# Patient Record
Sex: Male | Born: 1960 | Race: White | Hispanic: No | Marital: Married | Smoking: Former smoker
Health system: Southern US, Community
[De-identification: ages and names within clinical notes are randomized; demographics above are authoritative.]

## PROBLEM LIST (undated history)

## (undated) DIAGNOSIS — D049 Carcinoma in situ of skin, unspecified: Secondary | ICD-10-CM

## (undated) DIAGNOSIS — M5136 Other intervertebral disc degeneration, lumbar region: Secondary | ICD-10-CM

## (undated) DIAGNOSIS — E669 Obesity, unspecified: Secondary | ICD-10-CM

## (undated) DIAGNOSIS — M51369 Other intervertebral disc degeneration, lumbar region without mention of lumbar back pain or lower extremity pain: Secondary | ICD-10-CM

## (undated) DIAGNOSIS — M4310 Spondylolisthesis, site unspecified: Secondary | ICD-10-CM

## (undated) DIAGNOSIS — N529 Male erectile dysfunction, unspecified: Secondary | ICD-10-CM

## (undated) DIAGNOSIS — C801 Malignant (primary) neoplasm, unspecified: Secondary | ICD-10-CM

## (undated) HISTORY — DX: Male erectile dysfunction, unspecified: N52.9

---

## 1994-07-26 HISTORY — PX: VASECTOMY: SHX75

## 2009-01-30 ENCOUNTER — Ambulatory Visit: Payer: Self-pay | Admitting: Family Medicine

## 2009-11-08 IMAGING — CR CERVICAL SPINE - 2-3 VIEW
1 series · 4 of 4 positions shown · non-contrast
Comparison: none

REASON FOR EXAM: Neck Pain
COMMENTS:

[Series 2: view not recorded · 0.17mm/px · 4 of 4 slices shown]
[im 1/4]
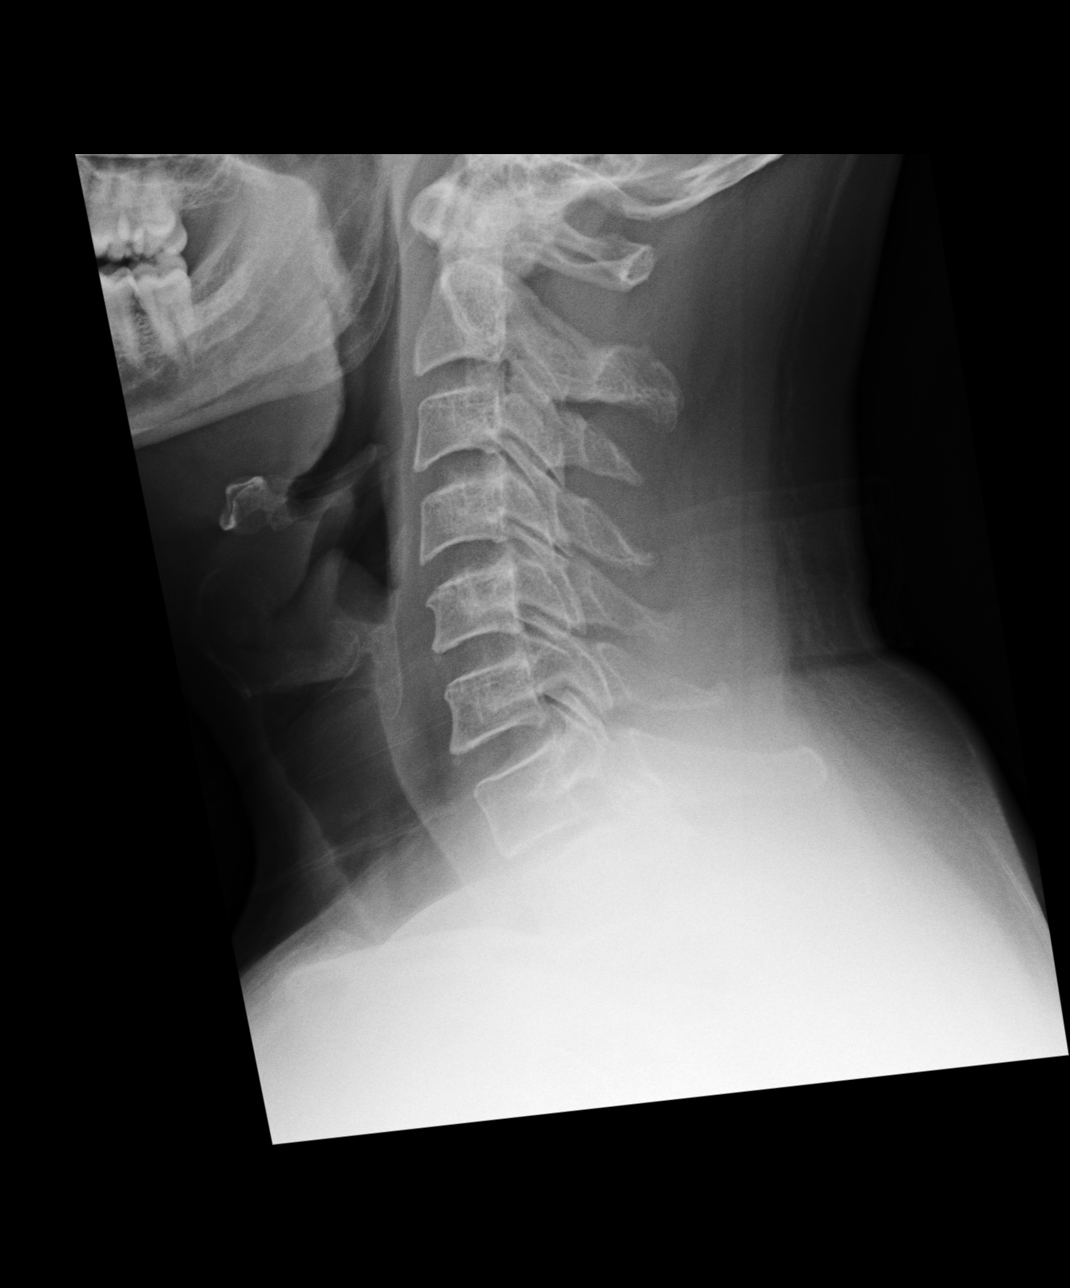
[im 2/4]
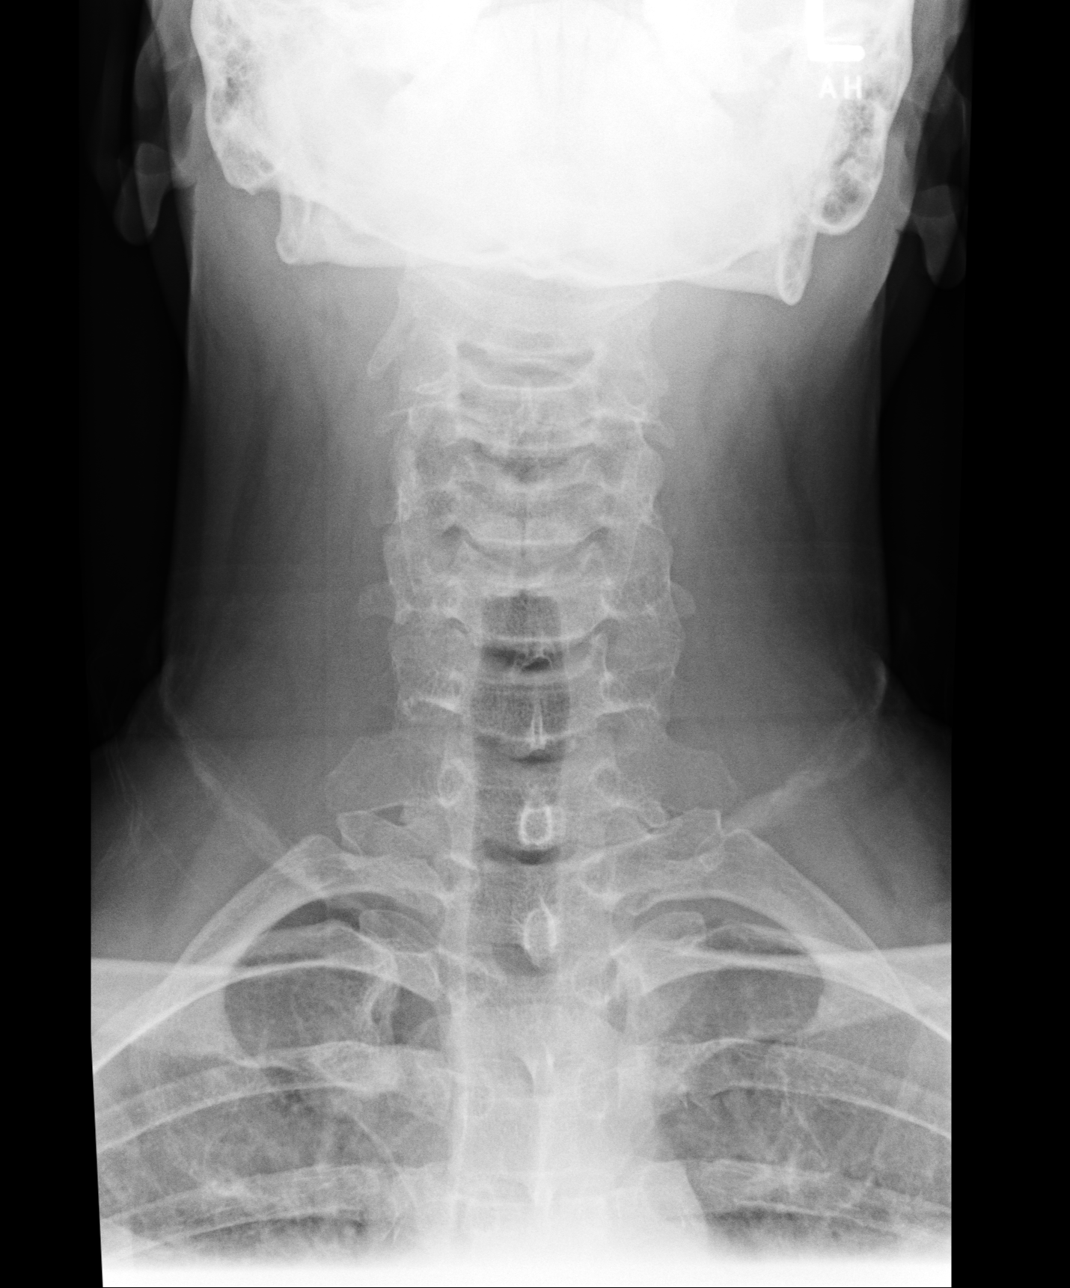
[im 3/4]
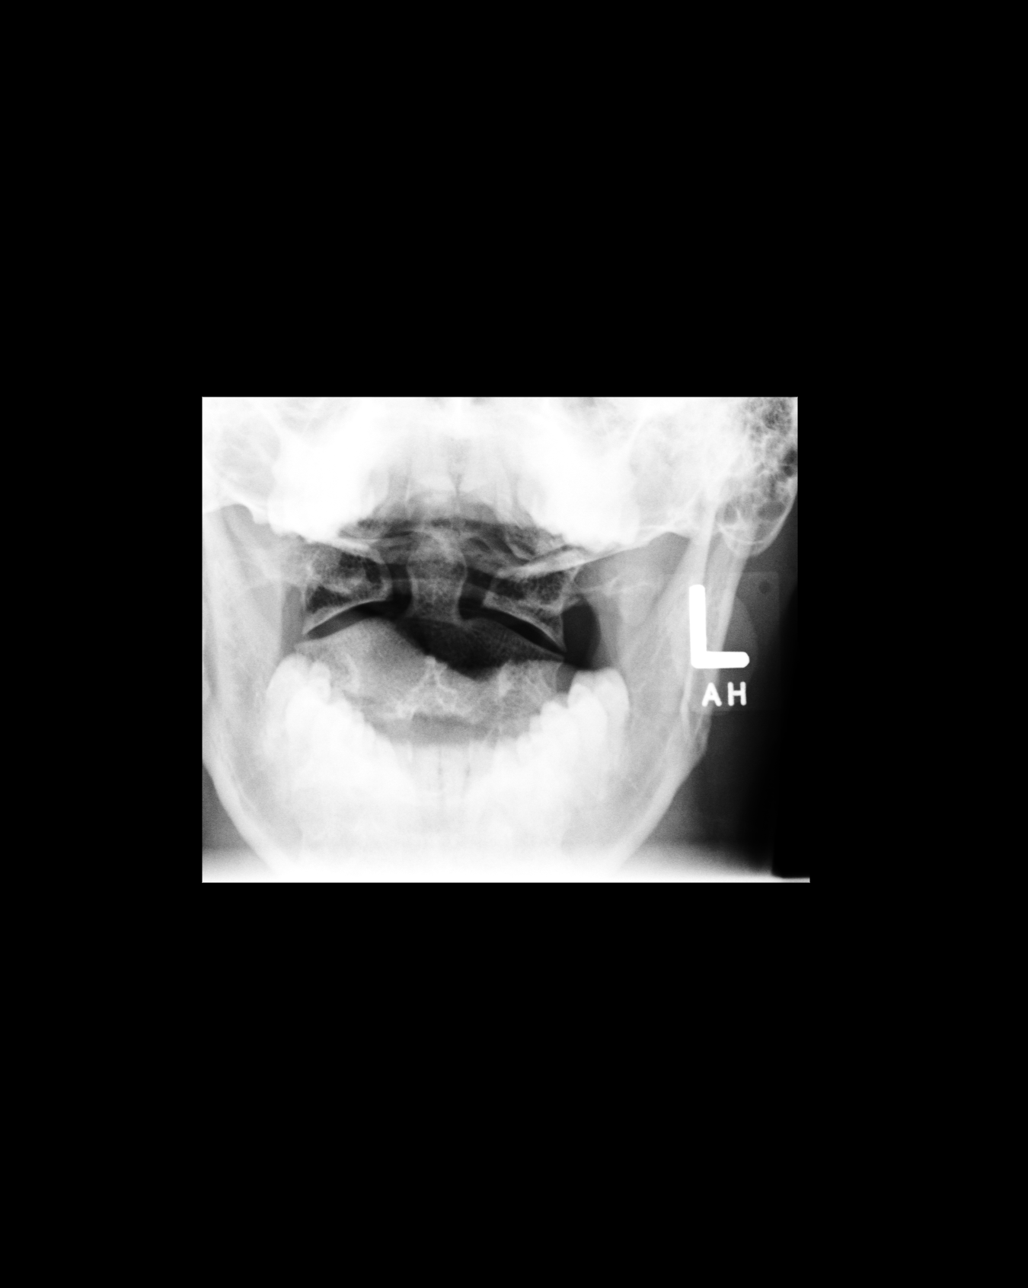
[im 4/4]
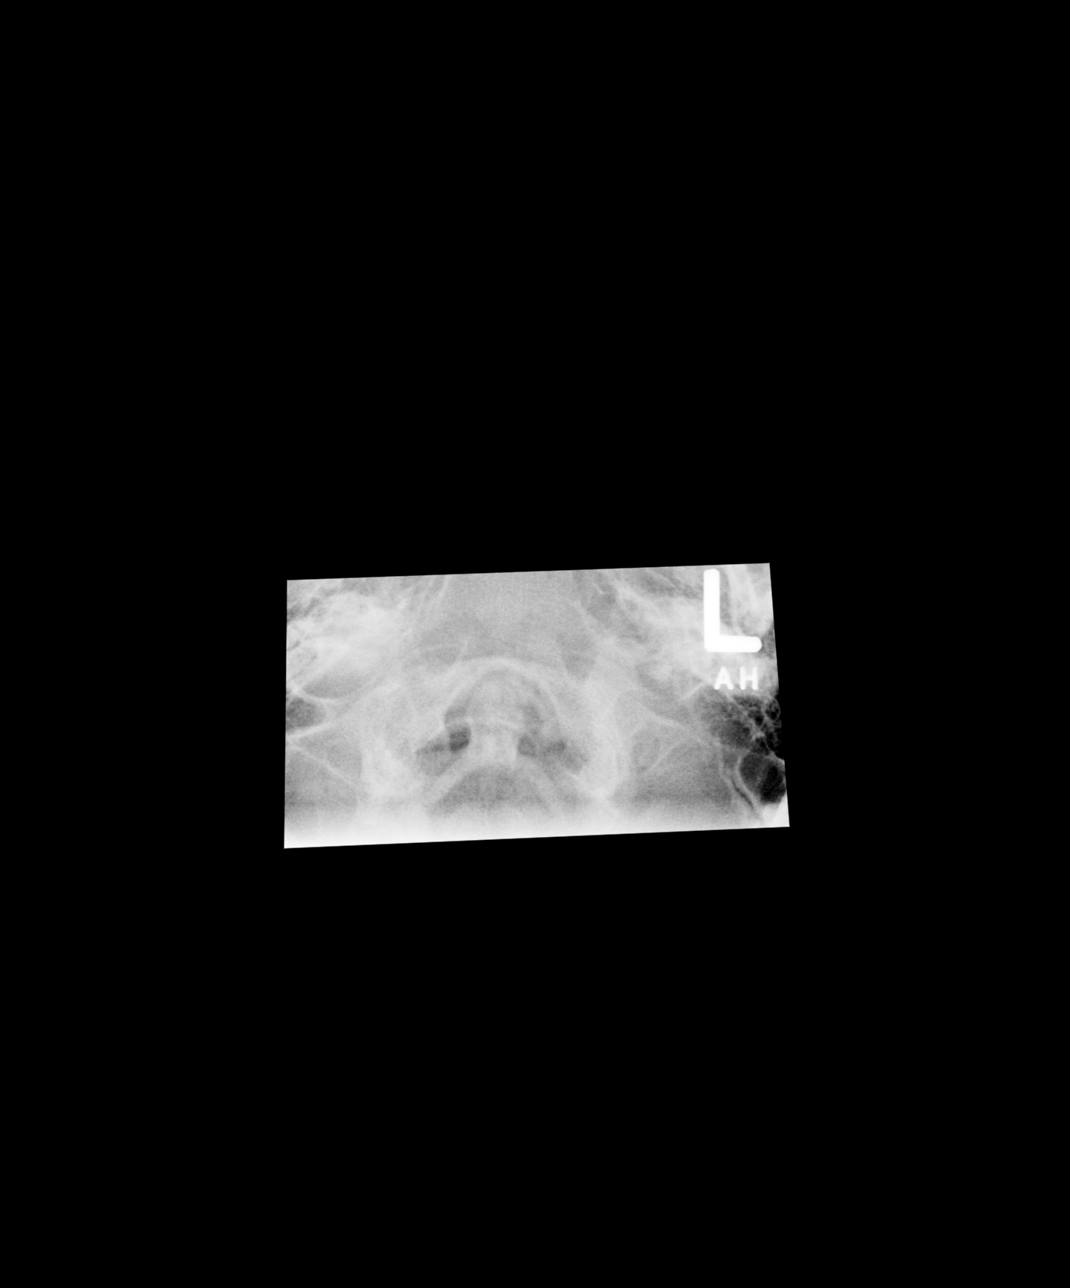

[4 of 4 positions shown; findings below may reference images not displayed]

PROCEDURE:     KDR - KDXR C-SPINE AP AND LATERAL  - January 30, 2009 [DATE]

RESULT:     The vertebral body heights and the intervertebral disc spaces
are well-maintained. The vertebral body alignment is normal. The odontoid
process is intact. No cervical rib formation is seen. There is incidentally
noted slight anterior spur formation at C5. The soft tissues of the cervical
area are normal in appearance.
IMPRESSION: 1.     No acute changes are identified.

## 2013-07-26 HISTORY — PX: MELANOMA EXCISION: SHX5266

## 2015-05-27 ENCOUNTER — Ambulatory Visit (INDEPENDENT_AMBULATORY_CARE_PROVIDER_SITE_OTHER): Payer: BLUE CROSS/BLUE SHIELD | Admitting: Family Medicine

## 2015-05-27 ENCOUNTER — Encounter: Payer: Self-pay | Admitting: Family Medicine

## 2015-05-27 VITALS — BP 122/84 | HR 73 | Temp 98.6°F | Resp 16 | Ht 67.0 in | Wt 229.6 lb

## 2015-05-27 DIAGNOSIS — S83419A Sprain of medial collateral ligament of unspecified knee, initial encounter: Secondary | ICD-10-CM

## 2015-05-27 DIAGNOSIS — N529 Male erectile dysfunction, unspecified: Secondary | ICD-10-CM | POA: Insufficient documentation

## 2015-05-27 DIAGNOSIS — Z9229 Personal history of other drug therapy: Secondary | ICD-10-CM | POA: Insufficient documentation

## 2015-05-27 MED ORDER — MELOXICAM 15 MG PO TABS: 15.0000 mg | ORAL_TABLET | Freq: Every day | ORAL | Status: DC

## 2015-05-27 NOTE — Progress Notes (Signed)
Subjective:     Patient ID: William HoesKarl Gutierrez, male   DOB: [birth year], 54 y.o.   MRN: [id number]  HPI  Chief Complaint  Patient presents with  . Knee Pain    Patient comes in office today with concerns of bilateral knee pain for the past 3 weeks. Patient states that pain came on sudden not related to any injury or accident. Patient states that he is a active runner and plays raquet ball but it had not caused him any pain and he does not recall over exerting himself. Patient reports he has pain when walking up and down stairs and when laying down at night patient describes pain in knees as a ache. Patient reports taking otc Ibuprofen and Excedrin for relief.   States he has taken ibuprofen every other day with mild improvement after the first 1.5 weeks. Prior to developing pain he was walking up to 3 miles a day and playing racquetball for 1.5 hours 3 x week. Knee pain occurred suddenly and simultaneously in both knees.   Review of Systems  Constitutional: Negative for fever and chills.  Musculoskeletal:       No prior hx of knee problems       Objective:   Physical Exam  Constitutional: He appears well-developed and well-nourished. No distress.  Musculoskeletal:  Bilateral knees without effusion or erythema. Tender over medial aspect of both knee with increased pain on MCL testing. No patellar tenderness. Increased pain with flexion > 90 degrees.       Assessment:    1. Knee MCL sprain, unspecified laterality, initial encounter: bilateral - meloxicam (MOBIC) 15 MG tablet; Take 1 tablet (15 mg total) by mouth daily.  Dispense: 30 tablet; Refill: 0    Plan:    Further evaluation if not improving over the next 1-2 weeks. Discussed icing and avoiding racquetball for now.

## 2015-05-27 NOTE — Patient Instructions (Signed)
Ice down your knees for 20 minutes at the end of your day or after walking. If not continuing to improve over the next week or two-call for further evaluation.

## 2019-04-11 ENCOUNTER — Ambulatory Visit: Payer: Self-pay

## 2019-04-11 ENCOUNTER — Other Ambulatory Visit: Payer: Self-pay

## 2019-04-11 DIAGNOSIS — Z23 Encounter for immunization: Secondary | ICD-10-CM

## 2019-11-08 ENCOUNTER — Telehealth: Payer: Self-pay | Admitting: Family Medicine

## 2019-11-08 NOTE — Telephone Encounter (Signed)
Copied from CRM [id number]. Topic: Appointment Scheduling - Scheduling Inquiry for Clinic >> Nov 07, 2019  4:59 PM Randol Kern wrote: Reason for CRM: Pt called to request new patient appt. Formerly under [employer].Is hoping to reestablish with male provider. Advised that none are accepting new patients  Best contact: [phone]   Dr. Sherrie Mustache, would you take this man as a patient.

## 2020-09-18 ENCOUNTER — Ambulatory Visit: Payer: BLUE CROSS/BLUE SHIELD | Admitting: Family Medicine

## 2020-11-11 ENCOUNTER — Ambulatory Visit: Payer: Self-pay | Admitting: Family Medicine

## 2020-12-19 ENCOUNTER — Ambulatory Visit: Payer: Self-pay | Admitting: Family Medicine

## 2021-04-15 ENCOUNTER — Other Ambulatory Visit: Payer: Self-pay

## 2021-04-15 ENCOUNTER — Ambulatory Visit: Payer: Self-pay

## 2021-04-15 DIAGNOSIS — Z23 Encounter for immunization: Secondary | ICD-10-CM

## 2021-10-29 ENCOUNTER — Other Ambulatory Visit: Payer: Self-pay | Admitting: Neurosurgery

## 2021-10-29 DIAGNOSIS — Z01818 Encounter for other preprocedural examination: Secondary | ICD-10-CM

## 2021-12-14 ENCOUNTER — Other Ambulatory Visit: Payer: Self-pay

## 2021-12-14 ENCOUNTER — Encounter: Payer: Self-pay | Admitting: Neurosurgery

## 2021-12-14 ENCOUNTER — Encounter
Admission: RE | Admit: 2021-12-14 | Discharge: 2021-12-14 | Disposition: A | Discharge status: Home / Self Care | Payer: BC Managed Care – PPO | Source: Ambulatory Visit | Attending: Neurosurgery | Admitting: Neurosurgery

## 2021-12-14 VITALS — BP 144/97 | HR 77 | Temp 97.9°F | Resp 18 | Ht 66.0 in | Wt 223.8 lb

## 2021-12-14 DIAGNOSIS — Z01812 Encounter for preprocedural laboratory examination: Secondary | ICD-10-CM | POA: Insufficient documentation

## 2021-12-14 DIAGNOSIS — Z01818 Encounter for other preprocedural examination: Secondary | ICD-10-CM

## 2021-12-14 HISTORY — DX: Carcinoma in situ of skin, unspecified: D04.9

## 2021-12-14 LAB — CBC
HCT: 43.2 % (ref 39.0–52.0)
Hemoglobin: 14.4 g/dL (ref 13.0–17.0)
MCH: 32.5 pg (ref 26.0–34.0)
MCHC: 33.3 g/dL (ref 30.0–36.0)
MCV: 97.5 fL (ref 80.0–100.0)
Platelets: 247 10*3/uL (ref 150–400)
RBC: 4.43 MIL/uL (ref 4.22–5.81)
RDW: 12.8 % (ref 11.5–15.5)
WBC: 5.7 10*3/uL (ref 4.0–10.5)
nRBC: 0 % (ref 0.0–0.2)

## 2021-12-14 LAB — TYPE AND SCREEN
ABO/RH(D): O POS
Antibody Screen: NEGATIVE

## 2021-12-14 LAB — URINALYSIS, ROUTINE W REFLEX MICROSCOPIC
Bilirubin Urine: NEGATIVE
Glucose, UA: NEGATIVE mg/dL
Hgb urine dipstick: NEGATIVE
Ketones, ur: NEGATIVE mg/dL
Leukocytes,Ua: NEGATIVE
Nitrite: NEGATIVE
Protein, ur: NEGATIVE mg/dL
Specific Gravity, Urine: 1.004 — ABNORMAL LOW (ref 1.005–1.030)
pH: 7 (ref 5.0–8.0)

## 2021-12-14 LAB — SURGICAL PCR SCREEN
MRSA, PCR: NEGATIVE
Staphylococcus aureus: POSITIVE — AB

## 2021-12-14 LAB — BASIC METABOLIC PANEL
Anion gap: 7 (ref 5–15)
BUN: 10 mg/dL (ref 6–20)
CO2: 26 mmol/L (ref 22–32)
Calcium: 9.8 mg/dL (ref 8.9–10.3)
Chloride: 108 mmol/L (ref 98–111)
Creatinine, Ser: 0.86 mg/dL (ref 0.61–1.24)
GFR, Estimated: >60 mL/min (ref >60)
Glucose, Bld: 105 mg/dL — ABNORMAL HIGH (ref 70–99)
Potassium: 4.4 mmol/L (ref 3.5–5.1)
Sodium: 141 mmol/L (ref 135–145)

## 2021-12-14 NOTE — Patient Instructions (Addendum)
Your procedure is scheduled on: Wednesday 12/23/21 Report to the Registration Desk on the 1st floor of the [facility]. To find out your arrival time, please call [phone] between 1PM - 3PM on: Tuesday 12/22/21 If your arrival time is 6:00 am, do not arrive prior to that time as the [facility] entrance doors do not open until 6:00 am.  REMEMBER: Instructions that are not followed completely may result in serious medical risk, up to and including death; or upon the discretion of your surgeon and anesthesiologist your surgery may need to be rescheduled.  Do not eat food after midnight the night before surgery.  No gum chewing, lozengers or hard candies.  You may however, drink CLEAR liquids up to 2 hours before you are scheduled to arrive for your surgery. Do not drink anything within 2 hours of your scheduled arrival time.  Clear liquids include: - water  - apple juice without pulp - gatorade (not RED colors) - black coffee or tea (Do NOT add milk or creamers to the coffee or tea) Do NOT drink anything that is not on this list.  TAKE THESE MEDICATIONS THE MORNING OF SURGERY WITH A SIP OF WATER: NONE  One week prior to surgery: Stop Anti-inflammatories (NSAIDS) such as, Advil, Aleve, Ibuprofen, Motrin, Naproxen, Naprosyn and Aspirin based products such as Excedrin, Goodys Powder, BC Powder.  Stop taking yourAscorbic Acid (VITAMIN C PO, CALCIUM PO,Ferrous Sulfate (IRON PO), Multiple Vitamins-Minerals (MULTIVITAMIN ADULTS 50+) TABS, Omega-3 Fatty Acids (OMEGA-3 FISH OIL PO), PSYLLIUM PO, VITAMIN D PO, and  ANY OVER THE COUNTER supplements until after surgery.  You may however, continue to take Tylenol if needed for pain up until the day of surgery.  No Alcohol for 24 hours before or after surgery.  No Smoking including e-cigarettes for 24 hours prior to surgery.  No chewable tobacco products for at least 6 hours prior to surgery.  No nicotine patches on the day of surgery.  Do  not use any "recreational" drugs for at least a week prior to your surgery.  Please be advised that the combination of cocaine and anesthesia may have negative outcomes, up to and including death. If you test positive for cocaine, your surgery will be cancelled.  On the morning of surgery brush your teeth with toothpaste and water, you may rinse your mouth with mouthwash if you wish. Do not swallow any toothpaste or mouthwash.  Use CHG Soap as directed on instruction sheet.  Do not wear jewelry.  Do not wear lotions, powders, or colognes.   Do not shave body from the neck down 48 hours prior to surgery just in case you cut yourself which could leave a site for infection.  Also, freshly shaved skin may become irritated if using the CHG soap.  Do not bring valuables to the hospital. [facility] is not responsible for any missing/lost belongings or valuables.   Notify your doctor if there is any change in your medical condition (cold, fever, infection).  Wear comfortable clothing (specific to your surgery type) to the hospital.  After surgery, you can help prevent lung complications by doing breathing exercises.  Take deep breaths and cough every 1-2 hours.   If you are being admitted to the hospital overnight, leave your suitcase in the car. After surgery it may be brought to your room.  If you are taking public transportation, you will need to have a responsible adult (18 years or older) with you. Please confirm with your physician that  it is acceptable to use public transportation.   Please call the Pre-admissions Testing Dept. at [phone] if you have any questions about these instructions.  Surgery Visitation Policy:  Patients undergoing a surgery or procedure may have two family members or support persons with them as long as the person is not COVID-19 positive or experiencing its symptoms.   Inpatient Visitation:    Visiting hours are 7 a.m. to 8 p.m. Up to four  visitors are allowed at one time in a patient room, including children. The visitors may rotate out with other people during the day. One designated support person (adult) may remain overnight.

## 2021-12-23 ENCOUNTER — Inpatient Hospital Stay
Admission: RE | Admit: 2021-12-23 | Discharge: 2021-12-25 | DRG: 455 | Disposition: A | Discharge status: Home / Self Care | Payer: BC Managed Care – PPO | Attending: Neurosurgery | Admitting: Neurosurgery

## 2021-12-23 ENCOUNTER — Other Ambulatory Visit: Payer: Self-pay

## 2021-12-23 ENCOUNTER — Inpatient Hospital Stay: Payer: BC Managed Care – PPO

## 2021-12-23 ENCOUNTER — Inpatient Hospital Stay: Payer: BC Managed Care – PPO | Admitting: Urgent Care

## 2021-12-23 ENCOUNTER — Encounter: Payer: Self-pay | Admitting: Neurosurgery

## 2021-12-23 ENCOUNTER — Encounter
Admission: RE | Disposition: A | Discharge status: Home / Self Care | Payer: Self-pay | Source: Home / Self Care | Attending: Neurosurgery

## 2021-12-23 DIAGNOSIS — Z85828 Personal history of other malignant neoplasm of skin: Secondary | ICD-10-CM | POA: Diagnosis not present

## 2021-12-23 DIAGNOSIS — F17210 Nicotine dependence, cigarettes, uncomplicated: Secondary | ICD-10-CM | POA: Diagnosis present

## 2021-12-23 DIAGNOSIS — Z833 Family history of diabetes mellitus: Secondary | ICD-10-CM | POA: Diagnosis not present

## 2021-12-23 DIAGNOSIS — Z825 Family history of asthma and other chronic lower respiratory diseases: Secondary | ICD-10-CM

## 2021-12-23 DIAGNOSIS — M5136 Other intervertebral disc degeneration, lumbar region: Secondary | ICD-10-CM | POA: Diagnosis present

## 2021-12-23 DIAGNOSIS — E669 Obesity, unspecified: Secondary | ICD-10-CM | POA: Diagnosis present

## 2021-12-23 DIAGNOSIS — M545 Low back pain, unspecified: Secondary | ICD-10-CM | POA: Diagnosis present

## 2021-12-23 DIAGNOSIS — M4310 Spondylolisthesis, site unspecified: Secondary | ICD-10-CM | POA: Diagnosis not present

## 2021-12-23 DIAGNOSIS — Z8249 Family history of ischemic heart disease and other diseases of the circulatory system: Secondary | ICD-10-CM | POA: Diagnosis not present

## 2021-12-23 DIAGNOSIS — M4306 Spondylolysis, lumbar region: Secondary | ICD-10-CM

## 2021-12-23 DIAGNOSIS — Z981 Arthrodesis status: Secondary | ICD-10-CM

## 2021-12-23 DIAGNOSIS — Z808 Family history of malignant neoplasm of other organs or systems: Secondary | ICD-10-CM

## 2021-12-23 DIAGNOSIS — M4317 Spondylolisthesis, lumbosacral region: Principal | ICD-10-CM | POA: Diagnosis present

## 2021-12-23 DIAGNOSIS — Z6836 Body mass index (BMI) 36.0-36.9, adult: Secondary | ICD-10-CM | POA: Diagnosis not present

## 2021-12-23 HISTORY — DX: Obesity, unspecified: E66.9

## 2021-12-23 HISTORY — PX: APPLICATION OF INTRAOPERATIVE CT SCAN: SHX6668

## 2021-12-23 HISTORY — DX: Other intervertebral disc degeneration, lumbar region: M51.36

## 2021-12-23 HISTORY — DX: Other intervertebral disc degeneration, lumbar region without mention of lumbar back pain or lower extremity pain: M51.369

## 2021-12-23 HISTORY — PX: ABDOMINAL EXPOSURE: SHX5708

## 2021-12-23 HISTORY — DX: Arthrodesis status: Z98.1

## 2021-12-23 HISTORY — PX: ANTERIOR LUMBAR FUSION: SHX1170

## 2021-12-23 HISTORY — DX: Spondylolisthesis, site unspecified: M43.10

## 2021-12-23 LAB — CREATININE, SERUM
Creatinine, Ser: 0.86 mg/dL (ref 0.61–1.24)
GFR, Estimated: >60 mL/min (ref >60)

## 2021-12-23 LAB — CBC
HCT: 42.5 % (ref 39.0–52.0)
Hemoglobin: 14.4 g/dL (ref 13.0–17.0)
MCH: 33 pg (ref 26.0–34.0)
MCHC: 33.9 g/dL (ref 30.0–36.0)
MCV: 97.5 fL (ref 80.0–100.0)
Platelets: 246 10*3/uL (ref 150–400)
RBC: 4.36 MIL/uL (ref 4.22–5.81)
RDW: 12.6 % (ref 11.5–15.5)
WBC: 18 10*3/uL — ABNORMAL HIGH (ref 4.0–10.5)
nRBC: 0 % (ref 0.0–0.2)

## 2021-12-23 LAB — ABO/RH: ABO/RH(D): O POS

## 2021-12-23 SURGERY — APPLICATION OF INTRAOPERATIVE CT SCAN
Anesthesia: General | Site: Spine Lumbar

## 2021-12-23 MED ORDER — CHLORHEXIDINE GLUCONATE 0.12 % MT SOLN
OROMUCOSAL | Status: AC
  Administered 2021-12-23: 15 mL via OROMUCOSAL
  Filled 2021-12-23: qty 15

## 2021-12-23 MED ORDER — ONDANSETRON HCL 4 MG/2ML IJ SOLN
INTRAMUSCULAR | Status: DC | PRN
  Administered 2021-12-23: 4 mg via INTRAVENOUS

## 2021-12-23 MED ORDER — ACETAMINOPHEN 10 MG/ML IV SOLN: 1000.0000 mg | Freq: Once | INTRAVENOUS | Status: DC | PRN

## 2021-12-23 MED ORDER — LIDOCAINE HCL (CARDIAC) PF 100 MG/5ML IV SOSY
PREFILLED_SYRINGE | INTRAVENOUS | Status: DC | PRN
  Administered 2021-12-23: 100 mg via INTRAVENOUS

## 2021-12-23 MED ORDER — PROPOFOL 10 MG/ML IV BOLUS
INTRAVENOUS | Status: DC | PRN
  Administered 2021-12-23: 200 mg via INTRAVENOUS
  Administered 2021-12-23: 100 mg via INTRAVENOUS

## 2021-12-23 MED ORDER — KETAMINE HCL 50 MG/5ML IJ SOSY
PREFILLED_SYRINGE | INTRAMUSCULAR | Status: AC
  Filled 2021-12-23: qty 5

## 2021-12-23 MED ORDER — REMIFENTANIL HCL 1 MG IV SOLR
INTRAVENOUS | Status: AC
  Filled 2021-12-23: qty 1000

## 2021-12-23 MED ORDER — BUPIVACAINE HCL (PF) 0.5 % IJ SOLN
INTRAMUSCULAR | Status: DC | PRN
  Administered 2021-12-23: 10 mL

## 2021-12-23 MED ORDER — FENTANYL CITRATE (PF) 100 MCG/2ML IJ SOLN
INTRAMUSCULAR | Status: AC
  Administered 2021-12-23: 50 ug via INTRAVENOUS
  Filled 2021-12-23: qty 2

## 2021-12-23 MED ORDER — HYDROMORPHONE HCL 1 MG/ML IJ SOLN
INTRAMUSCULAR | Status: AC
  Filled 2021-12-23: qty 1

## 2021-12-23 MED ORDER — METHOCARBAMOL 500 MG PO TABS
500.0000 mg | ORAL_TABLET | Freq: Four times a day (QID) | ORAL | Status: DC | PRN
  Administered 2021-12-23 – 2021-12-24 (×2): 500 mg via ORAL
  Filled 2021-12-23 (×2): qty 1

## 2021-12-23 MED ORDER — ONDANSETRON HCL 4 MG/2ML IJ SOLN: 4.0000 mg | Freq: Once | INTRAMUSCULAR | Status: DC | PRN

## 2021-12-23 MED ORDER — BUPIVACAINE-EPINEPHRINE (PF) 0.5% -1:200000 IJ SOLN
INTRAMUSCULAR | Status: DC | PRN
  Administered 2021-12-23: 9 mL

## 2021-12-23 MED ORDER — KETOROLAC TROMETHAMINE 15 MG/ML IJ SOLN
15.0000 mg | Freq: Four times a day (QID) | INTRAMUSCULAR | Status: AC
  Administered 2021-12-23 – 2021-12-24 (×4): 15 mg via INTRAVENOUS
  Filled 2021-12-23 (×3): qty 1

## 2021-12-23 MED ORDER — SUGAMMADEX SODIUM 200 MG/2ML IV SOLN
INTRAVENOUS | Status: DC | PRN
  Administered 2021-12-23: 50 mg via INTRAVENOUS

## 2021-12-23 MED ORDER — SODIUM CHLORIDE 0.9% FLUSH
3.0000 mL | Freq: Two times a day (BID) | INTRAVENOUS | Status: DC
  Administered 2021-12-23 – 2021-12-24 (×2): 3 mL via INTRAVENOUS

## 2021-12-23 MED ORDER — MENTHOL 3 MG MT LOZG: 1.0000 | LOZENGE | OROMUCOSAL | Status: DC | PRN

## 2021-12-23 MED ORDER — SODIUM CHLORIDE (PF) 0.9 % IJ SOLN
INTRAMUSCULAR | Status: AC
  Filled 2021-12-23: qty 20

## 2021-12-23 MED ORDER — KETAMINE HCL 10 MG/ML IJ SOLN
INTRAMUSCULAR | Status: DC | PRN
  Administered 2021-12-23: 20 mg via INTRAVENOUS
  Administered 2021-12-23 (×2): 10 mg via INTRAVENOUS
  Administered 2021-12-23: 40 mg via INTRAVENOUS

## 2021-12-23 MED ORDER — PROPOFOL 1000 MG/100ML IV EMUL
INTRAVENOUS | Status: AC
Start: 2021-12-23 — End: ?
  Filled 2021-12-23: qty 100

## 2021-12-23 MED ORDER — SENNA 8.6 MG PO TABS
1.0000 | ORAL_TABLET | Freq: Two times a day (BID) | ORAL | Status: DC
Start: 2021-12-23 — End: 2021-12-25
  Administered 2021-12-23 – 2021-12-25 (×4): 8.6 mg via ORAL
  Filled 2021-12-23 (×4): qty 1

## 2021-12-23 MED ORDER — BUPIVACAINE HCL (PF) 0.5 % IJ SOLN
INTRAMUSCULAR | Status: AC
  Filled 2021-12-23: qty 30

## 2021-12-23 MED ORDER — PHENYLEPHRINE 80 MCG/ML (10ML) SYRINGE FOR IV PUSH (FOR BLOOD PRESSURE SUPPORT)
PREFILLED_SYRINGE | INTRAVENOUS | Status: AC
  Filled 2021-12-23: qty 10

## 2021-12-23 MED ORDER — CHLORHEXIDINE GLUCONATE 0.12 % MT SOLN: 15.0000 mL | Freq: Once | OROMUCOSAL | Status: AC

## 2021-12-23 MED ORDER — OXYCODONE HCL 5 MG PO TABS
10.0000 mg | ORAL_TABLET | ORAL | Status: DC | PRN
  Administered 2021-12-24 – 2021-12-25 (×3): 10 mg via ORAL
  Filled 2021-12-23 (×3): qty 2

## 2021-12-23 MED ORDER — FLEET ENEMA 7-19 GM/118ML RE ENEM
1.0000 | ENEMA | Freq: Once | RECTAL | Status: AC | PRN
  Administered 2021-12-24: 1 via RECTAL

## 2021-12-23 MED ORDER — SURGIFLO WITH THROMBIN (HEMOSTATIC MATRIX KIT) OPTIME
TOPICAL | Status: DC | PRN
  Administered 2021-12-23: 1 via TOPICAL

## 2021-12-23 MED ORDER — MORPHINE SULFATE (PF) 2 MG/ML IV SOLN
2.0000 mg | INTRAVENOUS | Status: DC | PRN
  Administered 2021-12-23 – 2021-12-25 (×3): 2 mg via INTRAVENOUS
  Filled 2021-12-23 (×3): qty 1

## 2021-12-23 MED ORDER — FENTANYL CITRATE (PF) 100 MCG/2ML IJ SOLN
25.0000 ug | INTRAMUSCULAR | Status: DC | PRN
  Administered 2021-12-23 (×2): 50 ug via INTRAVENOUS

## 2021-12-23 MED ORDER — OXYCODONE HCL 5 MG PO TABS: 5.0000 mg | ORAL_TABLET | Freq: Once | ORAL | Status: DC | PRN

## 2021-12-23 MED ORDER — MIDAZOLAM HCL 2 MG/2ML IJ SOLN
INTRAMUSCULAR | Status: AC
  Filled 2021-12-23: qty 2

## 2021-12-23 MED ORDER — POLYETHYLENE GLYCOL 3350 17 G PO PACK
17.0000 g | PACK | Freq: Every day | ORAL | Status: DC | PRN
  Administered 2021-12-24: 17 g via ORAL
  Filled 2021-12-23: qty 1

## 2021-12-23 MED ORDER — LACTATED RINGERS IV SOLN: INTRAVENOUS | Status: DC

## 2021-12-23 MED ORDER — LACTATED RINGERS IV SOLN: INTRAVENOUS | Status: DC | PRN

## 2021-12-23 MED ORDER — SURGIPHOR WOUND IRRIGATION SYSTEM - OPTIME
TOPICAL | Status: DC | PRN
  Administered 2021-12-23: 450 mL via TOPICAL

## 2021-12-23 MED ORDER — ROCURONIUM BROMIDE 10 MG/ML (PF) SYRINGE
PREFILLED_SYRINGE | INTRAVENOUS | Status: AC
  Filled 2021-12-23: qty 10

## 2021-12-23 MED ORDER — PHENOL 1.4 % MT LIQD: 1.0000 | OROMUCOSAL | Status: DC | PRN

## 2021-12-23 MED ORDER — SODIUM CHLORIDE FLUSH 0.9 % IV SOLN
INTRAVENOUS | Status: AC
  Filled 2021-12-23: qty 20

## 2021-12-23 MED ORDER — CEFAZOLIN SODIUM-DEXTROSE 2-4 GM/100ML-% IV SOLN
INTRAVENOUS | Status: AC
  Filled 2021-12-23: qty 100

## 2021-12-23 MED ORDER — REMIFENTANIL HCL 1 MG IV SOLR
INTRAVENOUS | Status: DC | PRN
  Administered 2021-12-23: .1 ug/kg/min via INTRAVENOUS
  Administered 2021-12-23: .2 ug/kg/min via INTRAVENOUS

## 2021-12-23 MED ORDER — FENTANYL CITRATE (PF) 100 MCG/2ML IJ SOLN
INTRAMUSCULAR | Status: AC
  Filled 2021-12-23: qty 2

## 2021-12-23 MED ORDER — MIDAZOLAM HCL 2 MG/2ML IJ SOLN
INTRAMUSCULAR | Status: DC | PRN
  Administered 2021-12-23: 2 mg via INTRAVENOUS

## 2021-12-23 MED ORDER — BUPIVACAINE HCL (PF) 0.5 % IJ SOLN
INTRAMUSCULAR | Status: AC
  Filled 2021-12-23: qty 10

## 2021-12-23 MED ORDER — FAMOTIDINE 20 MG PO TABS
ORAL_TABLET | ORAL | Status: AC
  Administered 2021-12-23: 20 mg via ORAL
  Filled 2021-12-23: qty 1

## 2021-12-23 MED ORDER — SODIUM CHLORIDE 0.9 % IV SOLN: INTRAVENOUS | Status: DC

## 2021-12-23 MED ORDER — ACETAMINOPHEN 10 MG/ML IV SOLN
INTRAVENOUS | Status: DC | PRN
  Administered 2021-12-23: 1000 mg via INTRAVENOUS

## 2021-12-23 MED ORDER — ENOXAPARIN SODIUM 40 MG/0.4ML IJ SOSY
40.0000 mg | PREFILLED_SYRINGE | INTRAMUSCULAR | Status: DC
  Administered 2021-12-24 – 2021-12-25 (×2): 40 mg via SUBCUTANEOUS
  Filled 2021-12-23 (×2): qty 0.4

## 2021-12-23 MED ORDER — OXYCODONE HCL 5 MG/5ML PO SOLN: 5.0000 mg | Freq: Once | ORAL | Status: DC | PRN

## 2021-12-23 MED ORDER — ACETAMINOPHEN 500 MG PO TABS
1000.0000 mg | ORAL_TABLET | Freq: Four times a day (QID) | ORAL | Status: AC
  Administered 2021-12-23 – 2021-12-24 (×4): 1000 mg via ORAL
  Filled 2021-12-23 (×4): qty 2

## 2021-12-23 MED ORDER — ACETAMINOPHEN 10 MG/ML IV SOLN
INTRAVENOUS | Status: AC
  Filled 2021-12-23: qty 100

## 2021-12-23 MED ORDER — BUPIVACAINE LIPOSOME 1.3 % IJ SUSP
INTRAMUSCULAR | Status: AC
  Filled 2021-12-23: qty 20

## 2021-12-23 MED ORDER — LIDOCAINE HCL (PF) 2 % IJ SOLN
INTRAMUSCULAR | Status: AC
  Filled 2021-12-23: qty 5

## 2021-12-23 MED ORDER — PROPOFOL 1000 MG/100ML IV EMUL
INTRAVENOUS | Status: AC
  Filled 2021-12-23: qty 100

## 2021-12-23 MED ORDER — BISACODYL 10 MG RE SUPP
10.0000 mg | Freq: Every day | RECTAL | Status: DC | PRN
  Administered 2021-12-24: 10 mg via RECTAL
  Filled 2021-12-23: qty 1

## 2021-12-23 MED ORDER — PROPOFOL 10 MG/ML IV BOLUS
INTRAVENOUS | Status: AC
  Filled 2021-12-23: qty 20

## 2021-12-23 MED ORDER — ORAL CARE MOUTH RINSE: 15.0000 mL | Freq: Once | OROMUCOSAL | Status: AC

## 2021-12-23 MED ORDER — 0.9 % SODIUM CHLORIDE (POUR BTL) OPTIME
TOPICAL | Status: DC | PRN
  Administered 2021-12-23: 1000 mL

## 2021-12-23 MED ORDER — OXYCODONE HCL 5 MG PO TABS
5.0000 mg | ORAL_TABLET | ORAL | Status: DC | PRN
  Administered 2021-12-23 – 2021-12-24 (×3): 5 mg via ORAL
  Filled 2021-12-23 (×3): qty 1

## 2021-12-23 MED ORDER — PHENYLEPHRINE HCL (PRESSORS) 10 MG/ML IV SOLN
INTRAVENOUS | Status: DC | PRN
  Administered 2021-12-23 (×5): 160 ug via INTRAVENOUS

## 2021-12-23 MED ORDER — FAMOTIDINE 20 MG PO TABS: 20.0000 mg | ORAL_TABLET | Freq: Once | ORAL | Status: AC

## 2021-12-23 MED ORDER — PROPOFOL 500 MG/50ML IV EMUL
INTRAVENOUS | Status: DC | PRN
  Administered 2021-12-23: 150 ug/kg/min via INTRAVENOUS
  Administered 2021-12-23 (×3): 100 ug/kg/min via INTRAVENOUS

## 2021-12-23 MED ORDER — BUPIVACAINE LIPOSOME 1.3 % IJ SUSP
INTRAMUSCULAR | Status: DC | PRN
  Administered 2021-12-23: 20 mL

## 2021-12-23 MED ORDER — VANCOMYCIN HCL 1500 MG/300ML IV SOLN
1500.0000 mg | INTRAVENOUS | Status: AC
  Administered 2021-12-23: 1500 mg via INTRAVENOUS
  Filled 2021-12-23: qty 300

## 2021-12-23 MED ORDER — ONDANSETRON HCL 4 MG/2ML IJ SOLN: 4.0000 mg | Freq: Four times a day (QID) | INTRAMUSCULAR | Status: DC | PRN

## 2021-12-23 MED ORDER — HYDROMORPHONE HCL 1 MG/ML IJ SOLN: 0.5000 mg | INTRAMUSCULAR | Status: DC | PRN

## 2021-12-23 MED ORDER — METHOCARBAMOL 1000 MG/10ML IJ SOLN
500.0000 mg | Freq: Four times a day (QID) | INTRAVENOUS | Status: DC | PRN
  Filled 2021-12-23: qty 5

## 2021-12-23 MED ORDER — DEXAMETHASONE SODIUM PHOSPHATE 10 MG/ML IJ SOLN
INTRAMUSCULAR | Status: AC
  Filled 2021-12-23: qty 1

## 2021-12-23 MED ORDER — ONDANSETRON HCL 4 MG/2ML IJ SOLN
INTRAMUSCULAR | Status: AC
  Filled 2021-12-23: qty 2

## 2021-12-23 MED ORDER — CEFAZOLIN SODIUM-DEXTROSE 2-4 GM/100ML-% IV SOLN
2.0000 g | INTRAVENOUS | Status: AC
Start: 2021-12-23 — End: 2021-12-23
  Administered 2021-12-23 (×2): 2 g via INTRAVENOUS

## 2021-12-23 MED ORDER — FENTANYL CITRATE (PF) 100 MCG/2ML IJ SOLN
INTRAMUSCULAR | Status: DC | PRN
  Administered 2021-12-23 (×2): 100 ug via INTRAVENOUS

## 2021-12-23 MED ORDER — SODIUM CHLORIDE 0.9 % IV SOLN: 250.0000 mL | INTRAVENOUS | Status: DC

## 2021-12-23 MED ORDER — DEXAMETHASONE SODIUM PHOSPHATE 10 MG/ML IJ SOLN
INTRAMUSCULAR | Status: DC | PRN
  Administered 2021-12-23 (×2): 10 mg via INTRAVENOUS

## 2021-12-23 MED ORDER — ONDANSETRON HCL 4 MG PO TABS
4.0000 mg | ORAL_TABLET | Freq: Four times a day (QID) | ORAL | Status: DC | PRN
Start: 2021-12-23 — End: 2021-12-25

## 2021-12-23 MED ORDER — SODIUM CHLORIDE 0.9% FLUSH
3.0000 mL | INTRAVENOUS | Status: DC | PRN
Start: 2021-12-23 — End: 2021-12-24

## 2021-12-23 MED ORDER — ROCURONIUM BROMIDE 100 MG/10ML IV SOLN
INTRAVENOUS | Status: DC | PRN
  Administered 2021-12-23: 20 mg via INTRAVENOUS
  Administered 2021-12-23: 70 mg via INTRAVENOUS
  Administered 2021-12-23 (×2): 10 mg via INTRAVENOUS
  Administered 2021-12-23: 20 mg via INTRAVENOUS

## 2021-12-23 MED ORDER — KETOROLAC TROMETHAMINE 15 MG/ML IJ SOLN
INTRAMUSCULAR | Status: AC
  Filled 2021-12-23: qty 1

## 2021-12-23 MED ORDER — BUPIVACAINE-EPINEPHRINE (PF) 0.5% -1:200000 IJ SOLN
INTRAMUSCULAR | Status: AC
Start: 2021-12-23 — End: ?
  Filled 2021-12-23: qty 30

## 2021-12-23 MED ORDER — DOCUSATE SODIUM 100 MG PO CAPS
100.0000 mg | ORAL_CAPSULE | Freq: Two times a day (BID) | ORAL | Status: DC
  Administered 2021-12-23 – 2021-12-25 (×4): 100 mg via ORAL
  Filled 2021-12-23 (×4): qty 1

## 2021-12-23 SURGICAL SUPPLY — 105 items
ADH SKN CLS APL DERMABOND .7 (GAUZE/BANDAGES/DRESSINGS) ×3
AGENT HMST KT MTR STRL THRMB (HEMOSTASIS) ×1
APL PRP STRL LF DISP 70% ISPRP (MISCELLANEOUS) ×5
CAP LOCKING SPINE (Cap) ×4 IMPLANT
CHLORAPREP W/TINT 26 (MISCELLANEOUS) ×9 IMPLANT
CORD BIP STRL DISP 12FT (MISCELLANEOUS) ×2 IMPLANT
COUNTER NEEDLE 20/40 LG (NEEDLE) ×4 IMPLANT
COVER BACK TABLE REUSABLE LG (DRAPES) ×2 IMPLANT
CUP MEDICINE 2OZ PLAST GRAD ST (MISCELLANEOUS) ×2 IMPLANT
DERMABOND ADVANCED (GAUZE/BANDAGES/DRESSINGS) ×3
DERMABOND ADVANCED .7 DNX12 (GAUZE/BANDAGES/DRESSINGS) ×3 IMPLANT
DRAPE 3D C-ARM OEC (DRAPES) ×2 IMPLANT
DRAPE C-ARMOR (DRAPES) IMPLANT
DRAPE INCISE IOBAN 66X60 STRL (DRAPES) ×2 IMPLANT
DRAPE LAPAROTOMY 100X77 ABD (DRAPES) ×2 IMPLANT
DRAPE SCAN PATIENT (DRAPES) ×2 IMPLANT
DRAPE SURG 17X11 SM STRL (DRAPES) ×4 IMPLANT
DRESSING SURGICEL FIBRLLR 1X2 (HEMOSTASIS) IMPLANT
DRSG OPSITE POSTOP 4X6 (GAUZE/BANDAGES/DRESSINGS) ×3 IMPLANT
DRSG OPSITE POSTOP 4X8 (GAUZE/BANDAGES/DRESSINGS) ×1 IMPLANT
DRSG SURGICEL FIBRILLAR 1X2 (HEMOSTASIS)
ELECT BLADE 6.5 EXT (BLADE) ×2 IMPLANT
ELECT REM PT RETURN 9FT ADLT (ELECTROSURGICAL) ×4
ELECTRODE REM PT RTRN 9FT ADLT (ELECTROSURGICAL) ×2 IMPLANT
EX-PIN ORTHOLOCK NAV 4X150 (PIN) IMPLANT
FORCEPS BPLR BAYO 10IN 1.0TIP (ORTHOPEDIC DISPOSABLE SUPPLIES) ×1 IMPLANT
GLOVE BIOGEL PI IND STRL 6.5 (GLOVE) ×2 IMPLANT
GLOVE BIOGEL PI IND STRL 8.5 (GLOVE) ×3 IMPLANT
GLOVE BIOGEL PI INDICATOR 6.5 (GLOVE) ×2
GLOVE BIOGEL PI INDICATOR 8.5 (GLOVE) ×3
GLOVE SURG SYN 6.5 ES PF (GLOVE) ×4 IMPLANT
GLOVE SURG SYN 6.5 PF PI (GLOVE) ×2 IMPLANT
GLOVE SURG SYN 7.0 (GLOVE) ×8 IMPLANT
GLOVE SURG SYN 7.0 PF PI (GLOVE) ×4 IMPLANT
GLOVE SURG SYN 8.0 (GLOVE) ×6 IMPLANT
GLOVE SURG SYN 8.0 PF PI (GLOVE) ×3 IMPLANT
GLOVE SURG SYN 8.5  E (GLOVE) ×6
GLOVE SURG SYN 8.5 E (GLOVE) ×6 IMPLANT
GLOVE SURG SYN 8.5 PF PI (GLOVE) ×6 IMPLANT
GOWN SRG LRG LVL 4 IMPRV REINF (GOWNS) ×2 IMPLANT
GOWN SRG XL LVL 3 NONREINFORCE (GOWNS) ×4 IMPLANT
GOWN STRL NON-REIN TWL XL LVL3 (GOWNS) ×8
GOWN STRL REIN LRG LVL4 (GOWNS) ×4
GOWN STRL REUS W/ TWL XL LVL3 (GOWN DISPOSABLE) ×4 IMPLANT
GOWN STRL REUS W/TWL XL LVL3 (GOWN DISPOSABLE) ×8
GRADUATE 1200CC STRL 31836 (MISCELLANEOUS) ×2 IMPLANT
K-WIRE 1.6 NITINOL SHARP TIP (WIRE) ×8
KIT DISP MARS 3V (KITS) ×1 IMPLANT
KIT INFUSE MEDIUM (Orthopedic Implant) ×1 IMPLANT
KIT SPINAL PRONEVIEW (KITS) ×2 IMPLANT
KIT TURNOVER KIT A (KITS) ×2 IMPLANT
KWIRE 1.6 NITINOL SHARP TIP (WIRE) IMPLANT
LOOP RED MAXI  1X406MM (MISCELLANEOUS) ×1
LOOP VESSEL MAXI 1X406 RED (MISCELLANEOUS) ×1 IMPLANT
LOOP VESSEL MINI 0.8X406 BLUE (MISCELLANEOUS) ×2 IMPLANT
LOOPS BLUE MINI 0.8X406MM (MISCELLANEOUS) ×2
MANIFOLD NEPTUNE II (INSTRUMENTS) ×4 IMPLANT
MARKER SKIN DUAL TIP RULER LAB (MISCELLANEOUS) ×6 IMPLANT
MARKER SPHERE PSV REFLC 13MM (MARKER) ×14 IMPLANT
NDL SAFETY ECLIPSE 18X1.5 (NEEDLE) ×1 IMPLANT
NEEDLE HYPO 18GX1.5 SHARP (NEEDLE) ×2
NEEDLE HYPO 22GX1.5 SAFETY (NEEDLE) ×4 IMPLANT
PACK LAMINECTOMY NEURO (CUSTOM PROCEDURE TRAY) ×2 IMPLANT
PACK UNIVERSAL (MISCELLANEOUS) ×2 IMPLANT
PAD ARMBOARD 7.5X6 YLW CONV (MISCELLANEOUS) ×2 IMPLANT
PENCIL ELECTRO HAND CTR (MISCELLANEOUS) ×3 IMPLANT
PLEDGET CV PTFE 7X3 (MISCELLANEOUS) IMPLANT
ROD SPINE CURVE 5.5X50 (Rod) ×2 IMPLANT
SCREW CREO 6.5X45 FEN (Screw) ×4 IMPLANT
SCREW CREO MIS 30 TULIP (Screw) ×4 IMPLANT
SCREW LOCKING SD 25 (Screw) ×4 IMPLANT
SOLUTION IRRIG SURGIPHOR (IV SOLUTION) ×2 IMPLANT
SPACER SMALL 31X17X25 15D (Spacer) ×1 IMPLANT
SPONGE KITTNER 5P (MISCELLANEOUS) ×4 IMPLANT
SPONGE T-LAP 18X18 ~~LOC~~+RFID (SPONGE) ×2 IMPLANT
STAPLER SKIN PROX 35W (STAPLE) ×4 IMPLANT
SURGIFLO W/THROMBIN 8M KIT (HEMOSTASIS) ×2 IMPLANT
SUT DVC VLOC 3-0 CL 6 P-12 (SUTURE) ×4 IMPLANT
SUT ETHILON 3-0 FS-10 30 BLK (SUTURE)
SUT MNCRL 4-0 (SUTURE) ×2
SUT MNCRL 4-0 27XMFL (SUTURE) ×1
SUT PROLENE 2 0 SH DA (SUTURE) IMPLANT
SUT PROLENE 3 0 SH DA (SUTURE) IMPLANT
SUT PROLENE 5 0 RB 1 DA (SUTURE) ×4 IMPLANT
SUT SILK 2 0 (SUTURE) ×2
SUT SILK 2-0 30XBRD TIE 12 (SUTURE) ×1 IMPLANT
SUT SILK 3 0 REEL (SUTURE) ×2 IMPLANT
SUT VIC AB 0 CT1 27 (SUTURE) ×4
SUT VIC AB 0 CT1 27XCR 8 STRN (SUTURE) ×2 IMPLANT
SUT VIC AB 0 CT1 36 (SUTURE) ×3 IMPLANT
SUT VIC AB 2-0 CT1 (SUTURE) ×2 IMPLANT
SUT VIC AB 2-0 CT1 18 (SUTURE) ×6 IMPLANT
SUT VIC AB 3-0 SH 27 (SUTURE) ×4
SUT VIC AB 3-0 SH 27X BRD (SUTURE) ×2 IMPLANT
SUT VICRYL 3-0 CR8 SH (SUTURE) ×1 IMPLANT
SUT VICRYL+ 3-0 36IN CT-1 (SUTURE) ×1 IMPLANT
SUTURE EHLN 3-0 FS-10 30 BLK (SUTURE) IMPLANT
SUTURE MNCRL 4-0 27XMF (SUTURE) ×1 IMPLANT
SYR 10ML LL (SYRINGE) ×2 IMPLANT
SYR 30ML LL (SYRINGE) ×4 IMPLANT
TOWEL OR 17X26 4PK STRL BLUE (TOWEL DISPOSABLE) ×10 IMPLANT
TRAY FOLEY MTR SLVR 16FR STAT (SET/KITS/TRAYS/PACK) ×2 IMPLANT
TROCAR INSERT W/PEDICLE NDL (TROCAR) IMPLANT
TROCAR INSERT W/PEDICLE NDLE (TROCAR)
TUBING CONNECTING 10 (TUBING) ×5 IMPLANT

## 2021-12-23 NOTE — Anesthesia Procedure Notes (Signed)
Procedure Name: Intubation Date/Time: 12/23/2021 7:31 AM Performed by: Kerri Perches, CRNA Pre-anesthesia Checklist: Patient identified, Patient being monitored, Timeout performed, Emergency Drugs available and Suction available Patient Re-evaluated:Patient Re-evaluated prior to induction Oxygen Delivery Method: Circle system utilized Preoxygenation: Pre-oxygenation with 100% oxygen Induction Type: IV induction Ventilation: Mask ventilation without difficulty Laryngoscope Size: Mac and 4 Grade View: Grade I Tube type: Oral Tube size: 7.5 mm Number of attempts: 1 Airway Equipment and Method: Stylet Placement Confirmation: ETT inserted through vocal cords under direct vision, positive ETCO2 and breath sounds checked- equal and bilateral Secured at: 21 cm Tube secured with: Tape Dental Injury: Teeth and Oropharynx as per pre-operative assessment  Comments: Intubated by Everlene Other SRNA

## 2021-12-23 NOTE — Transfer of Care (Signed)
Immediate Anesthesia Transfer of Care Note  Patient: [place] Gipe  Procedure(s) Performed: APPLICATION OF INTRAOPERATIVE CT SCAN (Spine Lumbar) ANTERIOR LUMBAR FUSION 1 LEVEL L5-S1, POSTERIOR SPINAL FUSION L5-S1 (Spine Lumbar) ABDOMINAL EXPOSURE (Spine Lumbar)  Patient Location: PACU  Anesthesia Type:General  Level of Consciousness: drowsy  Airway & Oxygen Therapy: Patient Spontanous Breathing and Patient connected to face mask  Post-op Assessment: Report given to RN, Post -op Vital signs reviewed and stable and Patient moving all extremities X 4  Post vital signs: Reviewed and stable  Last Vitals:  Vitals Value Taken Time  BP 141/97 12/23/21 1226  Temp    Pulse 77 12/23/21 1230  Resp 14 12/23/21 1230  SpO2 99 % 12/23/21 1230  Vitals shown include unvalidated device data.  Last Pain:  Vitals:   12/23/21 0622  TempSrc: Oral  PainSc: 1          Complications: No notable events documented.

## 2021-12-23 NOTE — Progress Notes (Signed)
Dilaudid taken out in pacu and not given, umable to return to pyxis, Dilaudid taken to pharmacy and given to [facility].

## 2021-12-23 NOTE — Op Note (Signed)
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   Preoperative Note:   Risks of surgery discussed include: infection, bleeding, stroke, coma, death, paralysis, CSF leak, nerve/spinal cord injury, numbness, tingling, weakness, complex regional pain syndrome, recurrent stenosis and/or disc herniation, vascular injury, development of instability, neck/back pain, need for further surgery, persistent symptoms, development of deformity, and the risks of anesthesia. The patient understood these risks and agreed to proceed.  Please note that Drs. Dew and Schnier acted as co-surgeons for access to the anterior lumbar spine.  They will dictate a separate note.   NAME OF ANTERIOR PROCEDURE:               1. Anterior lumbar interbody fusion via a retroperitoneal approach at L5/S1 2. Placement of a Lordotic Globus Monument interbody cage, filled with Demineralized Bone Matrix and BMP  NAME OF POSTERIOR PROCEDURE: 1. Posterior instrumentation using Globus Creo Instrumentation 2. Posterolateral fusion, L5/S1  3. Use of stereotaxis ([facility])   PROCEDURE:  Patient was brought to the operating room, intubated, turned to the lateral position.  All pressure points were checked and double-checked.  The patient was prepped and draped in the standard fashion.   Drs. Dew and Schnier scrubbed in for exposure.  After they had adequately exposed the operative site, I scrubbed in and confirmed localization.  We then proceeded to perform discectomy at L5/S1. I utilized a combination of  rongeurs, rasp, curettes, and Kerrison punches to remove the majority of the disc.  We then introduced the trials and sized up to an appropriate size with good grip.  The endplates were prepared for arthrodesis.  We then introduced the Globus Monument interbody device, filled with BMP.  This was secured into position using screws at L5 and S1. The reduction mechanism was engaged and the anterolisthesis reduced. The final tightener was engaged.  Final radiographs were taken to confirm adequate placement of the graft.  Vascular surgery then scrubbed back in for closure.    After closing the anterior part in layers, the patient was repositioned into prone position.  All pressure points were checked and double-checked.  The posterior operative site was prepped and draped in standard fashion.  The stereotactic array was placed.  Stereotactic images were acquired using intraoperative CT scanning.  This was registered to the patient.  Using stereotaxis, screw trajectories were planned and incisions made.  The pedicles from L5 to S1 were cannulated bilaterally and K wires used to secure the tracks.  We then utilized a stereotactic screwdriver to place pedicle screws from L5 to S1.  At each level, 6.5x7mm Globus Creo pedicle screws were placed.  Once the screws were placed, the screw extensions were then linked, a path was formed for the rod and a rod was utilized to connect the screws.  We then compressed, torqued / counter-torqued and removed the screw assembly. Once performed on each side, the C-arm was brought back and to take confirmatory CT scan showing appropriate placement of all instrumentation and anatomic alignment.    Posterolateral arthrodesis was performed at L5-S1 using the remainder of the BMP.  Again we confirmed radiographically and began our  closure.  The wound was closed using 0 Vicryl interrupted suture in the fascia, 2-0 Vicryl inverted suture were placed in the subcutaneous tissue and  dermis. 3-0 monocryl was used for final closure. Dermabond was used to close the skin.    Needle, lap and all counts were correct at the end of the case.     Manning Charity PA assisted in the entire procedure.  Venetia Night MD Neurosurgery

## 2021-12-23 NOTE — Anesthesia Preprocedure Evaluation (Signed)
Anesthesia Evaluation  Patient identified by MRN, date of birth, ID band Patient awake    Reviewed: Allergy & Precautions, NPO status , Patient's Chart, lab work & pertinent test results  History of Anesthesia Complications Negative for: history of anesthetic complications  Airway Mallampati: I   Neck ROM: Full    Dental  (+) Missing   Pulmonary former smoker (quit 07/2021),    Pulmonary exam normal breath sounds clear to auscultation       Cardiovascular Exercise Tolerance: Good negative cardio ROS Normal cardiovascular exam Rhythm:Regular Rate:Normal     Neuro/Psych negative neurological ROS     GI/Hepatic negative GI ROS,   Endo/Other  Obesity   Renal/GU negative Renal ROS     Musculoskeletal Skin BCC   Abdominal   Peds  Hematology negative hematology ROS (+)   Anesthesia Other Findings   Reproductive/Obstetrics                             Anesthesia Physical Anesthesia Plan  ASA: 2  Anesthesia Plan: General   Post-op Pain Management: Regional block*   Induction: Intravenous  PONV Risk Score and Plan: 2 and Ondansetron, Dexamethasone and Treatment may vary due to age or medical condition  Airway Management Planned: Oral ETT  Additional Equipment:   Intra-op Plan:   Post-operative Plan: Extubation in OR  Informed Consent: I have reviewed the patients History and Physical, chart, labs and discussed the procedure including the risks, benefits and alternatives for the proposed anesthesia with the patient or authorized representative who has indicated his/her understanding and acceptance.     Dental advisory given  Plan Discussed with: CRNA  Anesthesia Plan Comments: (Plan for postoperative TAP blocks.  Patient consented for risks of anesthesia including but not limited to:  - adverse reactions to medications - damage to eyes, teeth, lips or other oral mucosa - nerve  damage due to positioning  - sore throat or hoarseness - damage to heart, brain, nerves, lungs, other parts of body or loss of life  Informed patient about role of CRNA in peri- and intra-operative care.  Patient voiced understanding.)        Anesthesia Quick Evaluation

## 2021-12-23 NOTE — Progress Notes (Signed)
PHARMACY -  BRIEF ANTIBIOTIC NOTE   Pharmacy has received consult(s) for Cefazolin and Vancomycin from an OR provider.  The patient's profile has been reviewed for ht/wt/allergies/indication/available labs.    One time order(s) placed for Cefazolin 2 gm and Vancomycin (15 mg/kg) 1500 mg per pt wt: 101.5 kg  Further antibiotics/pharmacy consults should be ordered by admitting physician if indicated.                       Thank you, Otelia Sergeant, PharmD, MBA 12/23/2021 1:40 AM

## 2021-12-23 NOTE — Plan of Care (Signed)
  Problem: Education: Goal: Ability to verbalize activity precautions or restrictions will improve Outcome: Progressing Goal: Knowledge of the prescribed therapeutic regimen will improve Outcome: Progressing Goal: Understanding of discharge needs will improve Outcome: Progressing   Problem: Activity: Goal: Ability to avoid complications of mobility impairment will improve Outcome: Progressing Goal: Ability to tolerate increased activity will improve Outcome: Progressing Goal: Will remain free from falls Outcome: Progressing   Problem: Bowel/Gastric: Goal: Gastrointestinal status for postoperative course will improve Outcome: Progressing   Problem: Clinical Measurements: Goal: Ability to maintain clinical measurements within normal limits will improve Outcome: Progressing Goal: Postoperative complications will be avoided or minimized Outcome: Progressing Goal: Diagnostic test results will improve Outcome: Progressing   Problem: Pain Management: Goal: Pain level will decrease Outcome: Progressing   Problem: Skin Integrity: Goal: Will show signs of wound healing Outcome: Progressing   Problem: Health Behavior/Discharge Planning: Goal: Identification of resources available to assist in meeting health care needs will improve Outcome: Progressing   Problem: Education: Goal: Knowledge of General Education information will improve Description: Including pain rating scale, medication(s)/side effects and non-pharmacologic comfort measures Outcome: Progressing   Problem: Bladder/Genitourinary: Goal: Urinary functional status for postoperative course will improve Outcome: Progressing   Problem: Clinical Measurements: Goal: Ability to maintain clinical measurements within normal limits will improve Outcome: Progressing Goal: Will remain free from infection Outcome: Progressing Goal: Diagnostic test results will improve Outcome: Progressing Goal: Respiratory complications  will improve Outcome: Progressing Goal: Cardiovascular complication will be avoided Outcome: Progressing   Problem: Nutrition: Goal: Adequate nutrition will be maintained Outcome: Progressing   Problem: Pain Managment: Goal: General experience of comfort will improve Outcome: Progressing   Problem: Skin Integrity: Goal: Risk for impaired skin integrity will decrease Outcome: Progressing

## 2021-12-23 NOTE — H&P (Signed)
History of Present Illness: 12/23/2021 William Gutierrez presents today with continued symptoms.  09/17/2021  William Gutierrez presents back to see me. He has continued and worsening symptoms. He has tried and failed physical therapy and injections. Having trouble walking.  02/19/2021 William Gutierrez has not had significant changes. He is still limited by pain. He comes today to review imaging.  He is down to smoking 2 cigarettes/day  01/08/2021 William Gutierrez is here today with a chief complaint of right-sided low back pain that radiates to his right buttock and lateral thigh. He also reports tingling in his right foot when standing and walking.  He can walk approximately quarter mile before he has pain primarily in his right leg. Laying on his back for more than 20 to 30 minutes cause pain. The pain is primarily in his right buttock and upper leg.  Sitting or bending can make his pain better. He denies weakness. His pain worsens through the day.  Bowel/Bladder Dysfunction: none  Conservative measures:  Physical therapy: participated in 5 visits at [facility] 12/18/20 - 01/06/21 Multimodal medical therapy including regular antiinflammatories: ibuprofen, Excedrin, delta 8 gummies Injections: has not received epidural steroid injections  Past Surgery: denies  William Gutierrez has no symptoms of cervical myelopathy.  The symptoms are causing a significant impact on the patient's life.   Review of Systems:  A 10 point review of systems is negative, except for the pertinent positives and negatives detailed in the HPI.  Past Medical History: Past Medical History:  Diagnosis Date   Allergy   Basal cell carcinoma (BCC) in situ of skin 2014   Past Surgical History: Past Surgical History:  Procedure Laterality Date   VASECTOMY 1997   ORAL SURGERY OPERATIVE NOTE 2016   No Known Allergies  No outpatient medications have been marked as taking for the 12/23/21 encounter [facility] Encounter).     Social History: Social History   Tobacco Use   Smoking status: Former  Packs/day: 0.25  Types: Cigarettes  Quit date: 06/27/2021  Years since quitting: 0.2   Smokeless tobacco: Never  Vaping Use   Vaping Use: Never used  Substance Use Topics   Alcohol use: Yes  Alcohol/week: 2.0 standard drinks  Types: 2 Cans of beer per week   Drug use: Not Currently  Comment: THC occasionally   Family Medical History: Family History  Problem Relation Age of Onset   Hypotension Mother   Skin cancer Mother   High blood pressure (Hypertension) Father   Diabetes type II Father   Heart failure Father   Coronary Artery Disease (Blocked arteries around heart) Father   Depression Sister   Liver cancer Brother   Diabetes type II Brother   No Known Problems Child   No Known Problems Son   Rashes / Skin problems Son   Eczema Son   Cancer Maternal Grandmother   Heart failure Paternal Grandmother   Valvular heart disease Paternal Grandmother   COPD Paternal Grandfather   Physical Examination:  Vitals:   12/23/21 0622  BP: 120/90  Pulse: 84  Resp: 16  Temp: 98 F (36.7 C)  SpO2: 100%    Heart sounds normal no MRG. Chest Clear to Auscultation Bilaterally.  General: Patient is well developed, well nourished, calm, collected, and in no apparent distress. Attention to examination is appropriate.  Psychiatric: Patient is non-anxious.  Head: Pupils equal, round, and reactive to light.  ENT: Oral mucosa appears well hydrated.  Neck: Supple. Full range of motion.  Respiratory: Patient  is breathing without any difficulty.  Extremities: No edema.  Vascular: Palpable dorsal pedal pulses.  Skin: On exposed skin, there are no abnormal skin lesions.  NEUROLOGICAL:   Awake, alert, oriented to person, place, and time. Speech is clear and fluent. Fund of knowledge is appropriate.   Cranial Nerves: Pupils equal round and reactive to light. Facial tone is symmetric. Facial sensation  is symmetric. Shoulder shrug is symmetric. Tongue protrusion is midline. There is no pronator drift.  ROM of spine: full. Strength: Side Biceps Triceps Deltoid Interossei Grip Wrist Ext. Wrist Flex.  R 5 5 5 5 5 5 5   L 5 5 5 5 5 5 5    Side Iliopsoas Quads Hamstring PF DF EHL  R 5 5 5 5 5 5   L 5 5 5 5 5 5    Reflexes are 1+ and symmetric at the biceps, triceps, brachioradialis, patella and achilles. Hoffman's is absent.  Clonus is not present. Toes are down-going.  Bilateral upper and lower extremity sensation is intact to light touch.  Gait is normal.  No difficulty with tandem gait.  No evidence of dysmetria noted.  Medical Decision Making  Imaging: Flexion-extension x-rays from Dec 05, 2020 show L5 pars defects with evidence of abnormal motion on flexion-extension films.  CT L spine 02/16/21 At L4-5, disc base narrowing with vacuum phenomenon. Grade 1-2 anterolisthesis of approximately 9 mm with broad-based pseudo disc and bilateral L4 pars defects and spondylolysis. There is foraminal stenosis affecting bilateral L4 nerve roots.  MRI L spine 02/16/21 And L4-5, disc base narrowing, desiccation, and vacuum phenomenon. Grade 1-2 anterolisthesis of approximately 9 mm with broad-based due to disc of listhesis and biforaminal stenosis. Bilateral L4 pars defects. Moderate grade spinal stenosis. Bilateral abutment of the exiting L4 nerve roots.  I have personally reviewed the images and agree with the above interpretation.  Assessment and Plan: William Gutierrez is a pleasant 61 y.o. male with anterolisthesis of L5 on S1 due to bilateral pars defects at L5. He has 9 mm of translation between neutral and flexion.  Please note that there are differences in numbering on the various images. Based on the relative position of his disc base and his iliac crest, we will call this L5-S1 for consistency.  At this point, he has worsening symptoms. He may need surgical intervention at some point in the  future, but would like to try to avoid this. I have asked him to quit smoking.  We will proceed with L5-S1 anterior lumbar interbody fusion with percutaneous fixation.    02/18/21 MD, [facility] Department of Neurosurgery

## 2021-12-23 NOTE — Op Note (Signed)
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   Anterior lumbar interbody fusion exposure via left retroperitoneal approach at L%-S1   COSURGEONS: Katha Cabal, MD and Algernon Huxley, MD   ASSISTANT(S): none   ANESTHESIA: general   ESTIMATED BLOOD LOSS: 25 cc   FINDING(S): 1.  none     INDICATIONS:   Patient presents with need for an anterior spinal L5-S1 interbody fusion.  We are performing the exposure for neurosurgery.  Risks and benefits were discussed and informed consent was obtained.   DESCRIPTION: After obtaining full informed written consent, the patient was brought back to the operating room and placed supine upon the operating table.  The patient was prepped and draped in the standard fashion.  Cosurgeons are used due to the complex nature of the exposure and the adjacent large vascular structures.  Co-surgeon will improve patient received the as well as improve care.   Appropriate timeout was called.  A left paramedian incision was created in the lower abdomen.  Care was taken to stay between the rectus and the obliques.  The fascia was divided in this location.  The peritoneum was identified and retracted posteriorly and medially and any small rents in the peritoneum were closed with 3-0 Vicryl sutures.  We continued to dissect preperitoneal space down to exposing the psoas muscle and identified the iliac artery and vein.  The exposure had to be carried posteriorly to the common iliac artery and vein.  Ureter was identified and protected from injury.  Several small venous branches were ligated and divided between silk ties as needed.  We continued the dissection to the proximal common iliac artery and vein and tediously retracted the artery and vein medially.  This exposed the L5-S1 interbody.   The Omnitract retractor was placed to aid with exposure as were renal vein retractors and the L5-S1 interbody was exposed to an adequate fashion for fusion by Dr. Izora Ribas.  After Dr. Izora Ribas completed his portion of the procedure, we proceeded with closure.  The deep fascia was closed with a running layer of 0 Vicryl.  A more superficial layer was then closed with a running layer of 2-0 Vicryl in the subcutaneous tissue was closed with 3-0 Vicryl.  Skin was coapted with 4-0 Monocryl.  Honeycomb dressing was placed.  At this point, we turned the case back over to Dr. Cari Caraway for the posterior portion of the procedure.   COMPLICATIONS: None   CONDITION: Stable   Katha Cabal, MD   12/23/2021, 11:45 am       This note was created with Dragon Medical transcription system. Any errors in dictation are purely unintentional.

## 2021-12-23 NOTE — Op Note (Addendum)
[place] [facility]     OPERATIVE NOTE   DATE: 12/23/2021   PRE-OPERATIVE DIAGNOSIS: Pars defect lumbar spine, anteriolisthesis   POST-OPERATIVE DIAGNOSIS: same as above   PROCEDURE: 1.   Anterior lumbar interbody fusion exposure via left retroperitoneal approach at L5-S1   COSURGEONS: William Pain MD, William Pilar MD   ASSISTANT(S): none   ANESTHESIA: general   ESTIMATED BLOOD LOSS: 50 cc   FINDING(S): 1.  none     INDICATIONS:   Patient presents with need for an anterior spinal L5-S1 interbody fusion.  We are performing the exposure for neurosurgery.  Risks and benefits were discussed and informed consent was obtained.  Co-surgeon's were used due to the complexity and difficulty of the exposure particularly on the large patient in a deep retroperitoneal access with iliac artery and vein mobilization required.   DESCRIPTION: After obtaining full informed written consent, the patient was brought back to the operating room and placed supine upon the operating table.  The patient was prepped and draped in the standard fashion.  Cosurgeons are used due to the complex nature of the exposure and the adjacent large vascular structures. A left paramedian incision was created in the lower abdomen.  Care was taken to stay between the rectus and the obliques.  The fascia was divided in this location.  The peritoneum was retracted medially and any small rents in the peritoneum were closed with Vicryl sutures.  We continued to dissect down to exposing the external iliac artery and vein.  The exposure had to be carried superiorly up to the common iliac artery and vein.  Ureter was identified and protected from harm.  Several small venous branches were ligated and divided between silk ties as needed.  We identified the internal iliac artery and vein as well.  We continued the dissection to the proximal common iliac artery and vein and tediously retracted the artery and vein medially.   This exposed the L5-S1 interbody.  The Omnitract retractor was used as were renal vein retractors and the L5-S1 interbody was exposed to an adequate fashion for fusion by Dr. [name]. After Dr. [name] completed his portion of the procedure, we proceeded with closure.  The deep fascia was closed with a running layer of 0 Vicryl.  A more superficial layer was then closed with a running layer of 2-0 Vicryl in the subcutaneous tissue was closed with 3-0 Vicryl.  Skin was coapted with staples.  Sterile dressing was placed. At this point, we turned the case back over to Dr. [name] for the posterior portion of the procedure.   COMPLICATIONS: None   CONDITION: Stable   William Gutierrez   05/21/2020, 3:29 PM       This note was created with Dragon Medical transcription system. Any errors in dictation are purely unintentional.

## 2021-12-23 NOTE — Anesthesia Procedure Notes (Signed)
Anesthesia Regional Block: TAP block   Pre-Anesthetic Checklist: , timeout performed,  Correct Patient, Correct Site, Correct Procedure, Correct Position, risks and benefits discussed,  Surgical consent,  Pre-op evaluation,  At surgeon's request and post-op pain management  Laterality: N/A  Prep: chloraprep       Needles:  Injection technique: Single-shot  Needle Type: Stimiplex          Additional Needles:   Procedures:,,,, ultrasound used (permanent image in chart),,   Motor weakness within 20 minutes.  Narrative:  Start time: 12/23/2021 12:05 PM End time: 12/23/2021 12:12 PM Injection made incrementally with aspirations every 5 mL.  Performed by: Personally  Anesthesiologist: Reed Breech, MD  Additional Notes: Functioning IV was confirmed and monitors applied. Ultrasound guidance: relevant anatomy identified, needle position confirmed, local anesthetic spread visualized around nerve(s), vascular puncture avoided.  Image saved for medical record.  Negative aspiration and no paresthesias; incremental administration of local anesthetic for total 20 ml Exparel (diluted) and 10 ml bupivacaine 0.5% (diluted) given over four abdominal quadrants. The patient tolerated the procedure well. Vital signs and moderate sedation medications recorded in RN notes.

## 2021-12-23 NOTE — Anesthesia Postprocedure Evaluation (Signed)
Anesthesia Post Note  Patient: William Gutierrez  Procedure(s) Performed: APPLICATION OF INTRAOPERATIVE CT SCAN (Spine Lumbar) ANTERIOR LUMBAR FUSION 1 LEVEL L5-S1, POSTERIOR SPINAL FUSION L5-S1 (Spine Lumbar) ABDOMINAL EXPOSURE (Spine Lumbar)  Patient location during evaluation: PACU Anesthesia Type: General Level of consciousness: awake and alert, oriented and patient cooperative Pain management: pain level controlled Vital Signs Assessment: post-procedure vital signs reviewed and stable Respiratory status: spontaneous breathing, nonlabored ventilation and respiratory function stable Cardiovascular status: blood pressure returned to baseline and stable Postop Assessment: adequate PO intake Anesthetic complications: no   No notable events documented.   Last Vitals:  Vitals:   12/23/21 1300 12/23/21 1315  BP: (!) 135/96 (!) 147/101  Pulse: 74 69  Resp: 11 11  Temp:    SpO2: 99% 100%    Last Pain:  Vitals:   12/23/21 1300  TempSrc:   PainSc: 3                  Reed Breech

## 2021-12-24 ENCOUNTER — Encounter: Payer: Self-pay | Admitting: Neurosurgery

## 2021-12-24 NOTE — Evaluation (Signed)
Occupational Therapy Evaluation Patient Details Name: William Gutierrez MRN: [id number] DOB: 12/18/1960 Today's Date: 12/24/2021   History of Present Illness William Gutierrez is a 60yoM who comes to [facility] on 5/31 for elective lumbar spine surgery to address ongoing Rt LBP c radiation into right buttocks and lateral thigh. Per neurosurgery pt has L5/S1 anterolisthesis with pars defect. Pt underwent posterolateral arthrodesis at L5-S1.   Clinical Impression   Pt was seen for OT evaluation this date. Prior to hospital admission, pt was independent with mobility and all ADLs. Pt lives with wife and adult children in a multi-level house - pt reports able to live on main level with bedroom/bathroom. Pt recalls 3/3 back precautions at start of session, instructed on functional applications during dressing/bathing/IADLs. MOD I with reacher and extra time for LBD. Pt and spouse questions answered within scope of practice, all education completed, will sign off. Upon hospital discharge, recommend no OT follow up.     Recommendations for follow up therapy are one component of a multi-disciplinary discharge planning process, led by the attending physician.  Recommendations may be updated based on patient status, additional functional criteria and insurance authorization.   Follow Up Recommendations  No OT follow up    Assistance Recommended at Discharge Set up Supervision/Assistance  Patient can return home with the following A little help with bathing/dressing/bathroom;Assistance with cooking/housework;Assist for transportation    Functional Status Assessment  Patient has had a recent decline in their functional status and demonstrates the ability to make significant improvements in function in a reasonable and predictable amount of time.  Equipment Recommendations  None recommended by OT    Recommendations for Other Services       Precautions / Restrictions Precautions Precautions: Fall;Back Precaution  Booklet Issued: No Precaution Comments: BAT- pt familiar with these from prior to admission Restrictions Weight Bearing Restrictions: No      Mobility Bed Mobility               General bed mobility comments: Not assessed; pt reported he is aware of how to perform log rolling in and out of bed.    Transfers                          Balance Overall balance assessment: Independent                                         ADL either performed or assessed with clinical judgement   ADL Overall ADL's : Needs assistance/impaired                                       General ADL Comments: MOD I with reacher and extra time don pants sit<>stand. Independent functional reaching task outside BOS (Pt reported he has a Sports administrator at home)      Extremity/Trunk Assessment Upper Extremity Assessment Upper Extremity Assessment: Overall WFL for tasks assessed   Lower Extremity Assessment Lower Extremity Assessment: Generalized weakness       Communication Communication Communication: No difficulties   Cognition  Home Living Family/patient expects to be discharged to:: Private residence Living Arrangements: Spouse/significant other;Children Available Help at Discharge: Family Type of Home: House Home Access: Stairs to enter Secretary/administrator of Steps: 2 Entrance Stairs-Rails: None Home Layout: Multi-level;Able to live on main level with bedroom/bathroom Alternate Level Stairs-Number of Steps: 13             Home Equipment: Toilet riser;Adaptive equipment Adaptive Equipment: Reacher        Prior Functioning/Environment Prior Level of Function : Independent/Modified Independent;Working/employed               ADLs Comments: works as a Psychologist, prison and probation services Problem List: Decreased strength;Decreased range of motion;Decreased  activity tolerance         OT Goals(Current goals can be found in the care plan section) Acute Rehab OT Goals Patient Stated Goal: To go home OT Goal Formulation: With patient/family Time For Goal Achievement: 01/07/22 Potential to Achieve Goals: Good   AM-PAC OT "6 Clicks" Daily Activity     Outcome Measure Help from another person eating meals?: None Help from another person taking care of personal grooming?: None Help from another person toileting, which includes using toliet, bedpan, or urinal?: A Little Help from another person bathing (including washing, rinsing, drying)?: A Little Help from another person to put on and taking off regular upper body clothing?: None Help from another person to put on and taking off regular lower body clothing?: A Little 6 Click Score: 21   End of Session Equipment Utilized During Treatment: Other (comment) (AE - reacher)  Activity Tolerance: Patient tolerated treatment well Patient left: in chair;with family/visitor present;with call bell/phone within reach  OT Visit Diagnosis: Unsteadiness on feet (R26.81)                Time: 9450-3888 OT Time Calculation (min): 16 min Charges:  OT General Charges $OT Visit: 1 Visit OT Evaluation $OT Eval Low Complexity: 1 Low  [employer], OTDS [place] Delmy Holdren 12/24/2021, 12:08 PM

## 2021-12-24 NOTE — Progress Notes (Signed)
Met with the patient and his spouse in the room He has a raised toilet and needs a rolling walker [employer] to deliver to the bedside He has transportation with his wife He stated he does not need home health He did well with PT 

## 2021-12-24 NOTE — Evaluation (Signed)
Physical Therapy Evaluation Patient Details Name: William Gutierrez MRN: [id number] DOB: [birth year] Today's Date: 12/24/2021  History of Present Illness  William Gutierrez is a 60yoM who comes to [facility] on 5/[facility] for elective lumbar spine surgery to address ongoing Rt LBP c radiation into right buttocks and lateral thigh. Per neurosurgery pt has L5/S1 anterolisthesis with pars defect. Pt underwent posterolateral arthrodesis at L5-S1.  Clinical Impression  Pt admitted c above Dx. Pt shows functional limitations due to the deficits listed below (see "PT Problem List"). Patient agreeable to PT evaluation. PLOF and home setup obtained. Pt able to perform bed mobility, transfers, and AMB with greater than typical effort, but safely and without any physical assistance needs. Pt is aware of BAT precautions. No brace needed per NSG orders. Pt performs 6 stairs with 1 railing. Patient's assessment reveals acute need for additional equipment for activity tolerance only and assistance to complete their typical IADL. At baseline, the patient is able to perform ADL with modified independence. Patient will benefit from skilled PT intervention to maximize independence and safety in mobility required for basic ADL performance at discharge.          Recommendations for follow up therapy are one component of a multi-disciplinary discharge planning process, led by the attending physician.  Recommendations may be updated based on patient status, additional functional criteria and insurance authorization.  Follow Up Recommendations Follow physician's recommendations for discharge plan and follow up therapies    Assistance Recommended at Discharge Set up Supervision/Assistance  Patient can return home with the following  Help with stairs or ramp for entrance;Assistance with cooking/housework;Assist for transportation    Equipment Recommendations Rolling walker (2 wheels)  Recommendations for Other Services       Functional  Status Assessment Patient has had a recent decline in their functional status and demonstrates the ability to make significant improvements in function in a reasonable and predictable amount of time.     Precautions / Restrictions Precautions Precautions: Fall;Back Precaution Booklet Issued: No Precaution Comments: BAT- pt familiar with these from prior to admission Restrictions Weight Bearing Restrictions: No      Mobility  Bed Mobility               General bed mobility comments: performed prior to entry, reportedly is aware of log roll, has been practicing prior to admission    Transfers Overall transfer level: Modified independent                 General transfer comment: grab bar from low toilet    Ambulation/Gait   Gait Distance (Feet): 400 Feet Assistive device: Rolling walker (2 wheels) Gait Pattern/deviations: WFL(Within Functional Limits) Gait velocity: 0.91m/s (RW improves tolerance and comfort)        Stairs Stairs: Yes Stairs assistance: Supervision Stair Management: One rail Right, Forwards Number of Stairs: 6 General stair comments: general well with reciprocal gait  Wheelchair Mobility    Modified Rankin (Stroke Patients Only)       Balance Overall balance assessment: Independent                                           Pertinent Vitals/Pain Pain Assessment Pain Assessment: 0-10 Pain Score: 5  Pain Location: Rt low back/hip Pain Intervention(s): Limited activity within patient's tolerance, Monitored during session, Premedicated before session    Home Living Family/patient expects to be discharged  to:: Private residence Living Arrangements: Spouse/significant other;Children Available Help at Discharge: Family Type of Home: House Home Access: Stairs to enter Entrance Stairs-Rails: None Entrance Stairs-Number of Steps: 2 Alternate Level Stairs-Number of Steps: 13 Home Layout: Multi-level;Able to live on  main level with bedroom/bathroom Home Equipment: None      Prior Function                       Hand Dominance        Extremity/Trunk Assessment        Lower Extremity Assessment Lower Extremity Assessment: Generalized weakness;Overall WFL for tasks assessed       Communication      Cognition Arousal/Alertness: Awake/alert Behavior During Therapy: WFL for tasks assessed/performed Overall Cognitive Status: Within Functional Limits for tasks assessed                                          General Comments      Exercises     Assessment/Plan    PT Assessment Patient needs continued PT services  PT Problem List Decreased activity tolerance;Decreased strength;Decreased mobility       PT Treatment Interventions DME instruction;Balance training;Gait training;Stair training;Functional mobility training;Therapeutic activities;Therapeutic exercise;Patient/family education    PT Goals (Current goals can be found in the Care Plan section)  Acute Rehab PT Goals Patient Stated Goal: return to home and move well PT Goal Formulation: With patient Time For Goal Achievement: 01/07/22 Potential to Achieve Goals: Fair    Frequency 7X/week     Co-evaluation               AM-PAC PT "6 Clicks" Mobility  Outcome Measure Help needed turning from your back to your side while in a flat bed without using bedrails?: None Help needed moving from lying on your back to sitting on the side of a flat bed without using bedrails?: None Help needed moving to and from a bed to a chair (including a wheelchair)?: None Help needed standing up from a chair using your arms (e.g., wheelchair or bedside chair)?: None Help needed to walk in hospital room?: None Help needed climbing 3-5 steps with a railing? : A Little 6 Click Score: 23    End of Session Equipment Utilized During Treatment: Gait belt Activity Tolerance: Patient tolerated treatment well Patient  left: in chair;with nursing/sitter in room;with call bell/phone within reach Nurse Communication: Mobility status PT Visit Diagnosis: Difficulty in walking, not elsewhere classified (R26.2);Other abnormalities of gait and mobility (R26.89)    Time: 3532-9924 PT Time Calculation (min) (ACUTE ONLY): 20 min   Charges:   PT Evaluation $PT Eval Low Complexity: 1 Low PT Treatments $Gait Training: 8-22 mins       9:34 AM, 12/24/21 Rosamaria Lints, PT, DPT Physical Therapist - [facility]  [phone] ([facility])    Barbarajean Kinzler C 12/24/2021, 9:32 AM

## 2021-12-24 NOTE — Plan of Care (Signed)
  Problem: Education: Goal: Ability to verbalize activity precautions or restrictions will improve Outcome: Progressing   Problem: Education: Goal: Knowledge of the prescribed therapeutic regimen will improve Outcome: Progressing   Problem: Education: Goal: Understanding of discharge needs will improve Outcome: Progressing   Problem: Activity: Goal: Ability to avoid complications of mobility impairment will improve Outcome: Progressing   Problem: Activity: Goal: Ability to tolerate increased activity will improve Outcome: Progressing   Problem: Bowel/Gastric: Goal: Gastrointestinal status for postoperative course will improve Outcome: Progressing   Problem: Clinical Measurements: Goal: Postoperative complications will be avoided or minimized Outcome: Progressing   Problem: Clinical Measurements: Goal: Diagnostic test results will improve Outcome: Progressing   Problem: Skin Integrity: Goal: Will show signs of wound healing Outcome: Progressing   Problem: Health Behavior/Discharge Planning: Goal: Identification of resources available to assist in meeting health care needs will improve Outcome: Progressing   Problem: Bladder/Genitourinary: Goal: Urinary functional status for postoperative course will improve Outcome: Progressing   Problem: Clinical Measurements: Goal: Ability to maintain clinical measurements within normal limits will improve Outcome: Progressing   Problem: Clinical Measurements: Goal: Cardiovascular complication will be avoided Outcome: Progressing   Problem: Clinical Measurements: Goal: Respiratory complications will improve Outcome: Progressing   Problem: Nutrition: Goal: Adequate nutrition will be maintained Outcome: Progressing   Problem: Coping: Goal: Level of anxiety will decrease Outcome: Progressing

## 2021-12-24 NOTE — Progress Notes (Signed)
    Attending Progress Note  History: William Gutierrez is s/p L5-S1 ALIF.  POD1: Pt reports tightness in his abdomen but overall feels his pain is relatively well controlled. Denying leg pain this morning. Foley has been removed and he has urinated.   Physical Exam: Vitals:   12/24/21 0446 12/24/21 0740  BP: 108/69 113/70  Pulse: 96 86  Resp: 20   Temp: 99 F (37.2 C) 99.4 F (37.4 C)  SpO2: 97% 100%    AA Ox3 CNI  Strength:5/5 throughout BLE Incisions c/d/I and covered with post-op bandages.   Data:  Recent Labs  Lab 12/23/21 1546  CREATININE 0.86   No results for input(s): AST, ALT, ALKPHOS in the last 168 hours.  Invalid input(s): TBILI   Recent Labs  Lab 12/23/21 1546  WBC 18.0*  HGB 14.4  HCT 42.5  PLT 246   No results for input(s): APTT, INR in the last 168 hours.       Other tests/results: none   Assessment/Plan:  William Gutierrez is a 61 y.o s/p L5-S1 ALIF on 12/23/21 for bilateral pars defects and anterolisthesis.   - mobilize - pain control - DVT prophylaxis - continue bowel regimen  - can advance diet when patient is passing gas.  - PTOT  Manning Charity PA-C Department of Neurosurgery

## 2021-12-25 MED ORDER — METHOCARBAMOL 500 MG PO TABS: 500.0000 mg | ORAL_TABLET | Freq: Four times a day (QID) | ORAL | 0 refills | Status: DC | PRN

## 2021-12-25 MED ORDER — CELECOXIB 200 MG PO CAPS: 200.0000 mg | ORAL_CAPSULE | Freq: Two times a day (BID) | ORAL | 0 refills | Status: DC

## 2021-12-25 MED ORDER — SENNA 8.6 MG PO TABS: 1.0000 | ORAL_TABLET | Freq: Every day | ORAL | 0 refills | Status: AC | PRN

## 2021-12-25 MED ORDER — OXYCODONE HCL 5 MG PO TABS
5.0000 mg | ORAL_TABLET | ORAL | 0 refills | Status: AC | PRN
Start: 2021-12-25 — End: 2021-12-30

## 2021-12-25 NOTE — Progress Notes (Signed)
    Attending Progress Note  History: Christy Friede is s/p L5-S1 ALIF.  POD2: Pt had small BM yesterday. Is passing gas. Some left low back and buttock pain overnight. Improved this morning  POD1: Pt reports tightness in his abdomen but overall feels his pain is relatively well controlled. Denying leg pain this morning. Foley has been removed and he has urinated.   Physical Exam: Vitals:   12/25/21 0451 12/25/21 0733  BP: (!) 141/94 129/82  Pulse: 86 (!) 104  Resp: 18   Temp: 99 F (37.2 C)   SpO2: 100% 95%    AA Ox3 CNI  Strength:5/5 throughout BLE Incisions c/d/I and covered with post-op bandages.   Data:  Recent Labs  Lab 12/23/21 1546  CREATININE 0.86    No results for input(s): AST, ALT, ALKPHOS in the last 168 hours.  Invalid input(s): TBILI   Recent Labs  Lab 12/23/21 1546  WBC 18.0*  HGB 14.4  HCT 42.5  PLT 246    No results for input(s): APTT, INR in the last 168 hours.       Other tests/results: none   Assessment/Plan:  Kerney Hopfensperger is a 61 y.o s/p L5-S1 ALIF on 12/23/21 for bilateral pars defects and anterolisthesis.   - mobilize - pain control - DVT prophylaxis - continue bowel regimen  - PTOT  Manning Charity PA-C Department of Neurosurgery

## 2021-12-25 NOTE — Progress Notes (Signed)
PT Cancellation Note  Patient Details Name: William Gutierrez MRN: [id number] DOB: Feb 26, 1961   Cancelled Treatment:    Reason Eval/Treat Not Completed: PT screened, no needs identified, will sign off (chart reviewed, treatment attempted. Pt in chair, getting meds. Reports he has been AMB the unit with wife ad lib. All of his PT related questions are addressed. Pt plans to purchase a RW on the way home from hospital. PT signing off. All PT goals met.)  9:34 AM, 12/25/21 Etta Grandchild, PT, DPT Physical Therapist - [facility]  [phone] ([place])    Nature Kueker C 12/25/2021, 9:33 AM

## 2021-12-25 NOTE — Discharge Summary (Signed)
Physician Discharge Summary  Patient ID: William Gutierrez MRN: [id number] DOB/AGE: 09/25/1960 61 y.o.  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  His intraoperative course was uncomplicated and he was admitted for pain control and evaluation by therapy.  He was able to advance his diet on postop day 1 as he had a small bowel movement.  He denies any significant abdominal pain.  His preoperative leg pain did improve.  He worked with therapy and was deemed appropriate for discharge home.  He was discharged on postop day 2 with medications for pain, stool softener, and muscle relaxer.  Consults: None  Significant Diagnostic Studies: none   Treatments: surgery: as above.  Please see separately dictated operative report for further details.  Discharge Exam: Blood pressure 129/82, pulse (!) 104, temperature 99 F (37.2 C), resp. rate 18, height 5\' 6"  (1.676 m), weight 101.5 kg, SpO2 95 %. AA Ox3 CNI Strength:5/5 throughout BLE Incisions c/d/I and covered with post-op bandages.   Disposition: Discharge disposition: 01-Home or Self Care       Discharge Instructions     Incentive spirometry RT   Complete by: As directed    Remove dressing in 24 hours   Complete by: As directed       Allergies as of 12/25/2021   No Known Allergies      Medication List     STOP taking these medications    Ibuprofen 200 MG Caps   meloxicam 15 MG tablet Commonly known as: MOBIC       TAKE these medications    CALCIUM PO Take by mouth 2 (two) times a week.   celecoxib 200 MG capsule Commonly known as: CeleBREX Take 1 capsule (200 mg total) by mouth 2 (two) times daily.   IRON PO Take by mouth 2 (two) times a week.    methocarbamol 500 MG tablet Commonly known as: ROBAXIN Take 1 tablet (500 mg total) by mouth every 6 (six) hours as needed for muscle spasms.   Multivitamin Adults 50+ Tabs Take by mouth 2 (two) times a week.   OMEGA-3 FISH OIL PO Take by mouth 2 (two) times a week.   oxyCODONE 5 MG immediate release tablet Commonly known as: Oxy IR/ROXICODONE Take 1 tablet (5 mg total) by mouth every 3 (three) hours as needed for up to 5 days for moderate pain ((score 4 to 6)).   PSYLLIUM PO Take by mouth 2 (two) times a week. fiber   senna 8.6 MG Tabs tablet Commonly known as: SENOKOT Take 1 tablet (8.6 mg total) by mouth daily as needed for mild constipation.   VITAMIN C PO Take by mouth 2 (two) times a week.   VITAMIN D PO Take by mouth 2 (two) times a week.               Durable Medical Equipment  (From admission, onward)           Start     Ordered   12/24/21 0940  For home use only DME Walker rolling  Once       Question Answer Comment  Walker: With 5 Inch Wheels   Patient needs a walker to treat with the following condition Sacrococcygeal disorders, not elsewhere classified  12/24/21 0940            Follow-up Information     Susanne Borders, PA Follow up in 2 week(s).   Why: For post-op and incision check. Appointment date and time on pre-op paperwork. Contact information: [address] [place] [address] [phone]                 Signed: Susanne Borders 12/25/2021, 9:08 AM

## 2021-12-25 NOTE — Discharge Instructions (Addendum)
NEUROSURGERY DISCHARGE INSTRUCTIONS  Admission diagnosis: S/P spinal fusion [Z98.1]  Operative procedure: L5-S1 ALIF.  What to do after you leave the hospital:  Recommended diet: regular diet. Increase protein intake to promote wound healing.  Recommended activity: no lifting, driving, or strenuous exercise for 4 weeks . You should walk multiple times per day  Special Instructions  No straining, no heavy lifting > 10lbs x 4 weeks.  Keep incision area clean and dry. May shower in 2 days. No baths or pools for 6 weeks.  Please remove dressing tomorrow, no need to apply a bandage afterwards  You have no sutures to remove, the skin is closed with adhesive  Please take pain medications as directed. Take a stool softener if on pain medications   Please Report any of the following: Nausea or Vomiting, Temperature is greater than 101.57F (38.1C) degrees, Dizziness, Abdominal Pain, Difficulty Breathing or Shortness of Breath, Inability to Eat, drink Fluids, or Take medications, Bleeding, swelling, or drainage from surgical incision sites, New numbness or weakness, and Bowel or bladder dysfunction to the neurosurgeon on call at [phone]  Additional Follow up appointments Please follow up with Manning Charity PA-C in [place] [facility] as scheduled in 2-3 weeks   Please see below for scheduled appointments:  No future appointments.

## 2021-12-25 NOTE — Plan of Care (Addendum)
Patient complained about back pain which he says appear to be more than usual on his left side. PRN med's were given during this shift. Please see MAR. Call bell was left within reach.     Problem: Education: Goal: Ability to verbalize activity precautions or restrictions will improve Outcome: Progressing   Problem: Education: Goal: Knowledge of the prescribed therapeutic regimen will improve Outcome: Progressing   Problem: Education: Goal: Understanding of discharge needs will improve Outcome: Progressing   Problem: Activity: Goal: Ability to avoid complications of mobility impairment will improve Outcome: Progressing   Problem: Activity: Goal: Will remain free from falls Outcome: Progressing   Problem: Bowel/Gastric: Goal: Gastrointestinal status for postoperative course will improve Outcome: Progressing   Problem: Clinical Measurements: Goal: Ability to maintain clinical measurements within normal limits will improve Outcome: Progressing   Problem: Clinical Measurements: Goal: Postoperative complications will be avoided or minimized Outcome: Progressing   Problem: Clinical Measurements: Goal: Diagnostic test results will improve Outcome: Progressing   Problem: Clinical Measurements: Goal: Ability to maintain clinical measurements within normal limits will improve Outcome: Progressing   Problem: Skin Integrity: Goal: Risk for impaired skin integrity will decrease Outcome: Progressing

## 2022-01-27 ENCOUNTER — Telehealth: Payer: Self-pay

## 2022-01-27 ENCOUNTER — Other Ambulatory Visit: Payer: Self-pay | Admitting: Neurosurgery

## 2022-01-27 MED ORDER — CELECOXIB 200 MG PO CAPS
200.0000 mg | ORAL_CAPSULE | Freq: Two times a day (BID) | ORAL | 0 refills | Status: DC
Start: 2022-01-27 — End: 2022-03-30

## 2022-01-27 MED ORDER — METHOCARBAMOL 500 MG PO TABS: 500.0000 mg | ORAL_TABLET | Freq: Three times a day (TID) | ORAL | 1 refills | Status: DC | PRN

## 2022-01-27 NOTE — Telephone Encounter (Signed)
Received the following message from Mr Hsiung:  "Hello, The Celebrex prescription was refilled automatically. Will we be refilling the methocarbamol or is it one we do not extend?   I am taking 2 to 3 per day (one with 2 acetaminophen, usually three times a day, sometimes twice), and am down to 3 or 4 days worth.   Thanks Royal Hawthorn"

## 2022-01-27 NOTE — Progress Notes (Signed)
Celebrex refilled 

## 2022-02-01 ENCOUNTER — Other Ambulatory Visit: Payer: Self-pay

## 2022-02-01 DIAGNOSIS — Z981 Arthrodesis status: Secondary | ICD-10-CM

## 2022-02-04 ENCOUNTER — Ambulatory Visit
Admission: RE | Admit: 2022-02-04 | Discharge: 2022-02-04 | Disposition: A | Discharge status: Home / Self Care | Payer: BC Managed Care – PPO | Attending: Neurosurgery | Admitting: Neurosurgery

## 2022-02-04 ENCOUNTER — Encounter: Payer: Self-pay | Admitting: Neurosurgery

## 2022-02-04 ENCOUNTER — Ambulatory Visit
Admission: RE | Admit: 2022-02-04 | Discharge: 2022-02-04 | Disposition: A | Discharge status: Home / Self Care | Payer: BC Managed Care – PPO | Source: Ambulatory Visit | Attending: Neurosurgery | Admitting: Neurosurgery

## 2022-02-04 ENCOUNTER — Ambulatory Visit: Payer: BC Managed Care – PPO | Admitting: Neurosurgery

## 2022-02-04 VITALS — BP 128/78 | Temp 98.1°F | Ht 66.0 in | Wt 222.2 lb

## 2022-02-04 DIAGNOSIS — Z981 Arthrodesis status: Secondary | ICD-10-CM

## 2022-02-04 MED ORDER — GABAPENTIN 300 MG PO CAPS: 300.0000 mg | ORAL_CAPSULE | Freq: Three times a day (TID) | ORAL | 0 refills | Status: DC

## 2022-02-04 NOTE — Progress Notes (Signed)
   DOS: 12/23/21 (ALIF L4-5/PSF)  HISTORY OF PRESENT ILLNESS: 02/04/2022 [name] is status post anterior lumbar interbody fusion at L4-5.  His right leg pain is gone.  He is having left leg pain in a similar pattern.  It might be slightly better than it was previously.Marland Kitchen   PHYSICAL EXAMINATION:   Vitals:   02/04/22 1040  BP: 128/78  Temp: 98.1 F (36.7 C)   General: Patient is well developed, well nourished, calm, collected, and in no apparent distress.  NEUROLOGICAL:  General: In no acute distress.  Awake, alert, oriented to person, place, and time. Pupils equal round and reactive to light.   Strength:  Side Iliopsoas Quads Hamstring PF DF EHL  R 5 5 5 5 5 5   L 5 5 5 5 5 5    Incision c/d/i   ROS (Neurologic): Negative except as noted above  IMAGING: No complications noted  ASSESSMENT/PLAN:  William Gutierrez is doing fair after anterior lumbar interbody fusion.  Unfortunately, he has radiculitis of the left lower extremity which I suspect is due to traction.  I will start him on physical therapy and gabapentin.  I would like to see him back in 6 weeks with x-rays.     I spent a total of 15 minutes in face-to-face and non-face-to-face activities related to this patient's care today.   MD, [facility] Department of Neurosurgery

## 2022-02-08 ENCOUNTER — Encounter: Payer: Self-pay | Admitting: Neurosurgery

## 2022-02-17 ENCOUNTER — Other Ambulatory Visit: Payer: Self-pay | Admitting: Neurosurgery

## 2022-02-17 MED ORDER — OXYCODONE HCL 5 MG PO TABS: 5.0000 mg | ORAL_TABLET | ORAL | 0 refills | Status: AC | PRN

## 2022-03-01 ENCOUNTER — Other Ambulatory Visit: Payer: Self-pay | Admitting: Neurosurgery

## 2022-03-01 MED ORDER — GABAPENTIN 300 MG PO CAPS: 300.0000 mg | ORAL_CAPSULE | Freq: Three times a day (TID) | ORAL | 0 refills | Status: DC

## 2022-03-26 ENCOUNTER — Other Ambulatory Visit: Payer: Self-pay

## 2022-03-26 DIAGNOSIS — Z981 Arthrodesis status: Secondary | ICD-10-CM

## 2022-03-30 ENCOUNTER — Ambulatory Visit
Admission: RE | Admit: 2022-03-30 | Discharge: 2022-03-30 | Disposition: A | Discharge status: Home / Self Care | Payer: BC Managed Care – PPO | Source: Ambulatory Visit | Attending: Neurosurgery | Admitting: Neurosurgery

## 2022-03-30 ENCOUNTER — Encounter: Payer: Self-pay | Admitting: Neurosurgery

## 2022-03-30 ENCOUNTER — Ambulatory Visit (INDEPENDENT_AMBULATORY_CARE_PROVIDER_SITE_OTHER): Payer: BC Managed Care – PPO | Admitting: Neurosurgery

## 2022-03-30 ENCOUNTER — Ambulatory Visit
Admission: RE | Admit: 2022-03-30 | Discharge: 2022-03-30 | Disposition: A | Discharge status: Home / Self Care | Payer: BC Managed Care – PPO | Attending: Neurosurgery | Admitting: Neurosurgery

## 2022-03-30 VITALS — BP 155/105 | HR 92 | Ht 66.0 in | Wt 218.4 lb

## 2022-03-30 DIAGNOSIS — Z09 Encounter for follow-up examination after completed treatment for conditions other than malignant neoplasm: Secondary | ICD-10-CM

## 2022-03-30 DIAGNOSIS — M4310 Spondylolisthesis, site unspecified: Secondary | ICD-10-CM

## 2022-03-30 DIAGNOSIS — Z981 Arthrodesis status: Secondary | ICD-10-CM

## 2022-03-30 NOTE — Progress Notes (Signed)
   DOS: 12/23/21 (ALIF L4-5/PSF)  HISTORY OF PRESENT ILLNESS: 03/30/2022 [name] is status post anterior lumbar interbody fusion at L4-5.  His right leg pain is gone.  He has been doing physical therapy and made a lot of progress.  PHYSICAL EXAMINATION:   Vitals:   03/30/22 1010  BP: (!) 155/105  Pulse: 92   General: Patient is well developed, well nourished, calm, collected, and in no apparent distress.  NEUROLOGICAL:  General: In no acute distress.  Awake, alert, oriented to person, place, and time. Pupils equal round and reactive to light.   Strength:  Side Iliopsoas Quads Hamstring PF DF EHL  R 5 5 5 5 5 5   L 5 5 5 5 5 5    Incision c/d/i   ROS (Neurologic): Negative except as noted above  IMAGING: No complications noted  ASSESSMENT/PLAN:  William Gutierrez is doing well after anterior lumbar interbody fusion.  He has made a lot of progress and improvements.  He is now taking gabapentin once a day.    He will continue exercises and physical therapy.  He is now off activity limitations.  I released him to go back to donating platelets.  I will see him back in 6 months with x-rays.      I spent a total of 15 minutes in face-to-face and non-face-to-face activities related to this patient's care today.   MD, [facility] Department of Neurosurgery

## 2022-04-28 ENCOUNTER — Other Ambulatory Visit (HOSPITAL_COMMUNITY): Payer: Self-pay

## 2022-04-28 ENCOUNTER — Other Ambulatory Visit: Payer: Self-pay

## 2022-04-28 MED ORDER — GABAPENTIN 300 MG PO CAPS
300.0000 mg | ORAL_CAPSULE | Freq: Every day | ORAL | 1 refills | Status: DC
  Filled 2022-04-28: qty 30, 30d supply, fill #0

## 2022-04-28 MED ORDER — GABAPENTIN 300 MG PO CAPS: 300.0000 mg | ORAL_CAPSULE | Freq: Every day | ORAL | 1 refills | Status: DC

## 2022-04-28 NOTE — Addendum Note (Signed)
Addended by: Herb Grays on: 04/28/2022 02:20 PM   Modules accepted: Orders

## 2022-09-24 ENCOUNTER — Other Ambulatory Visit: Payer: Self-pay

## 2022-09-24 DIAGNOSIS — Z981 Arthrodesis status: Secondary | ICD-10-CM

## 2022-09-27 ENCOUNTER — Other Ambulatory Visit: Payer: Self-pay

## 2022-09-27 DIAGNOSIS — Z981 Arthrodesis status: Secondary | ICD-10-CM

## 2022-09-28 ENCOUNTER — Ambulatory Visit
Admission: RE | Admit: 2022-09-28 | Discharge: 2022-09-28 | Disposition: A | Discharge status: Home / Self Care | Payer: BC Managed Care – PPO | Attending: Neurosurgery | Admitting: Neurosurgery

## 2022-09-28 ENCOUNTER — Ambulatory Visit: Payer: BC Managed Care – PPO | Admitting: Neurosurgery

## 2022-09-28 ENCOUNTER — Ambulatory Visit
Admission: RE | Admit: 2022-09-28 | Discharge: 2022-09-28 | Disposition: A | Discharge status: Home / Self Care | Payer: BC Managed Care – PPO | Source: Ambulatory Visit | Attending: Neurosurgery | Admitting: Neurosurgery

## 2022-09-28 ENCOUNTER — Encounter: Payer: Self-pay | Admitting: Neurosurgery

## 2022-09-28 VITALS — BP 132/93 | HR 75 | Wt 226.6 lb

## 2022-09-28 DIAGNOSIS — M4310 Spondylolisthesis, site unspecified: Secondary | ICD-10-CM | POA: Diagnosis not present

## 2022-09-28 DIAGNOSIS — Z981 Arthrodesis status: Secondary | ICD-10-CM | POA: Diagnosis not present

## 2022-09-28 DIAGNOSIS — Z09 Encounter for follow-up examination after completed treatment for conditions other than malignant neoplasm: Secondary | ICD-10-CM | POA: Diagnosis not present

## 2022-09-28 NOTE — Progress Notes (Signed)
   DOS: 12/23/21 (ALIF L4-5/PSF)  HISTORY OF PRESENT ILLNESS: 09/28/2022 [name] is status post anterior lumbar interbody fusion at L4-5.  He is doing well.    He has numbness in his left foot which waxes and wanes.  He has subjective foot drop that worsens through the day, but he is able to walk normally.   PHYSICAL EXAMINATION:   Vitals:   09/28/22 1319  BP: (!) 132/93  Pulse: 75   General: Patient is well developed, well nourished, calm, collected, and in no apparent distress.  NEUROLOGICAL:  General: In no acute distress.  Awake, alert, oriented to person, place, and time. Pupils equal round and reactive to light.   Strength:  Side Iliopsoas Quads Hamstring PF DF EHL  R '5 5 5 5 5 5  '$ L '5 5 5 5 5 5   '$ Incision c/d/i   ROS (Neurologic): Negative except as noted above  IMAGING: No complications noted  ASSESSMENT/PLAN:  William Gutierrez is doing well after anterior lumbar interbody fusion.  I am very pleased with his progress.  I will see him back on an as-needed basis.  I think he has some relative foot drop that I cannot appreciate due to traction on his L5 nerve root from the change in position with the surgery.  He has improved substantially over the past several months.       I spent a total of 15 minutes in face-to-face and non-face-to-face activities related to this patient's care today.   Meade Maw MD, [facility] Department of Neurosurgery

## 2022-10-01 IMAGING — DX DG OR LOCAL ABDOMEN
1 series · 1 of 1 positions shown · non-contrast
Comparison: None.

CLINICAL DATA: Foreign body protocol.

EXAM:
OR LOCAL ABDOMEN

[abdomen supine]
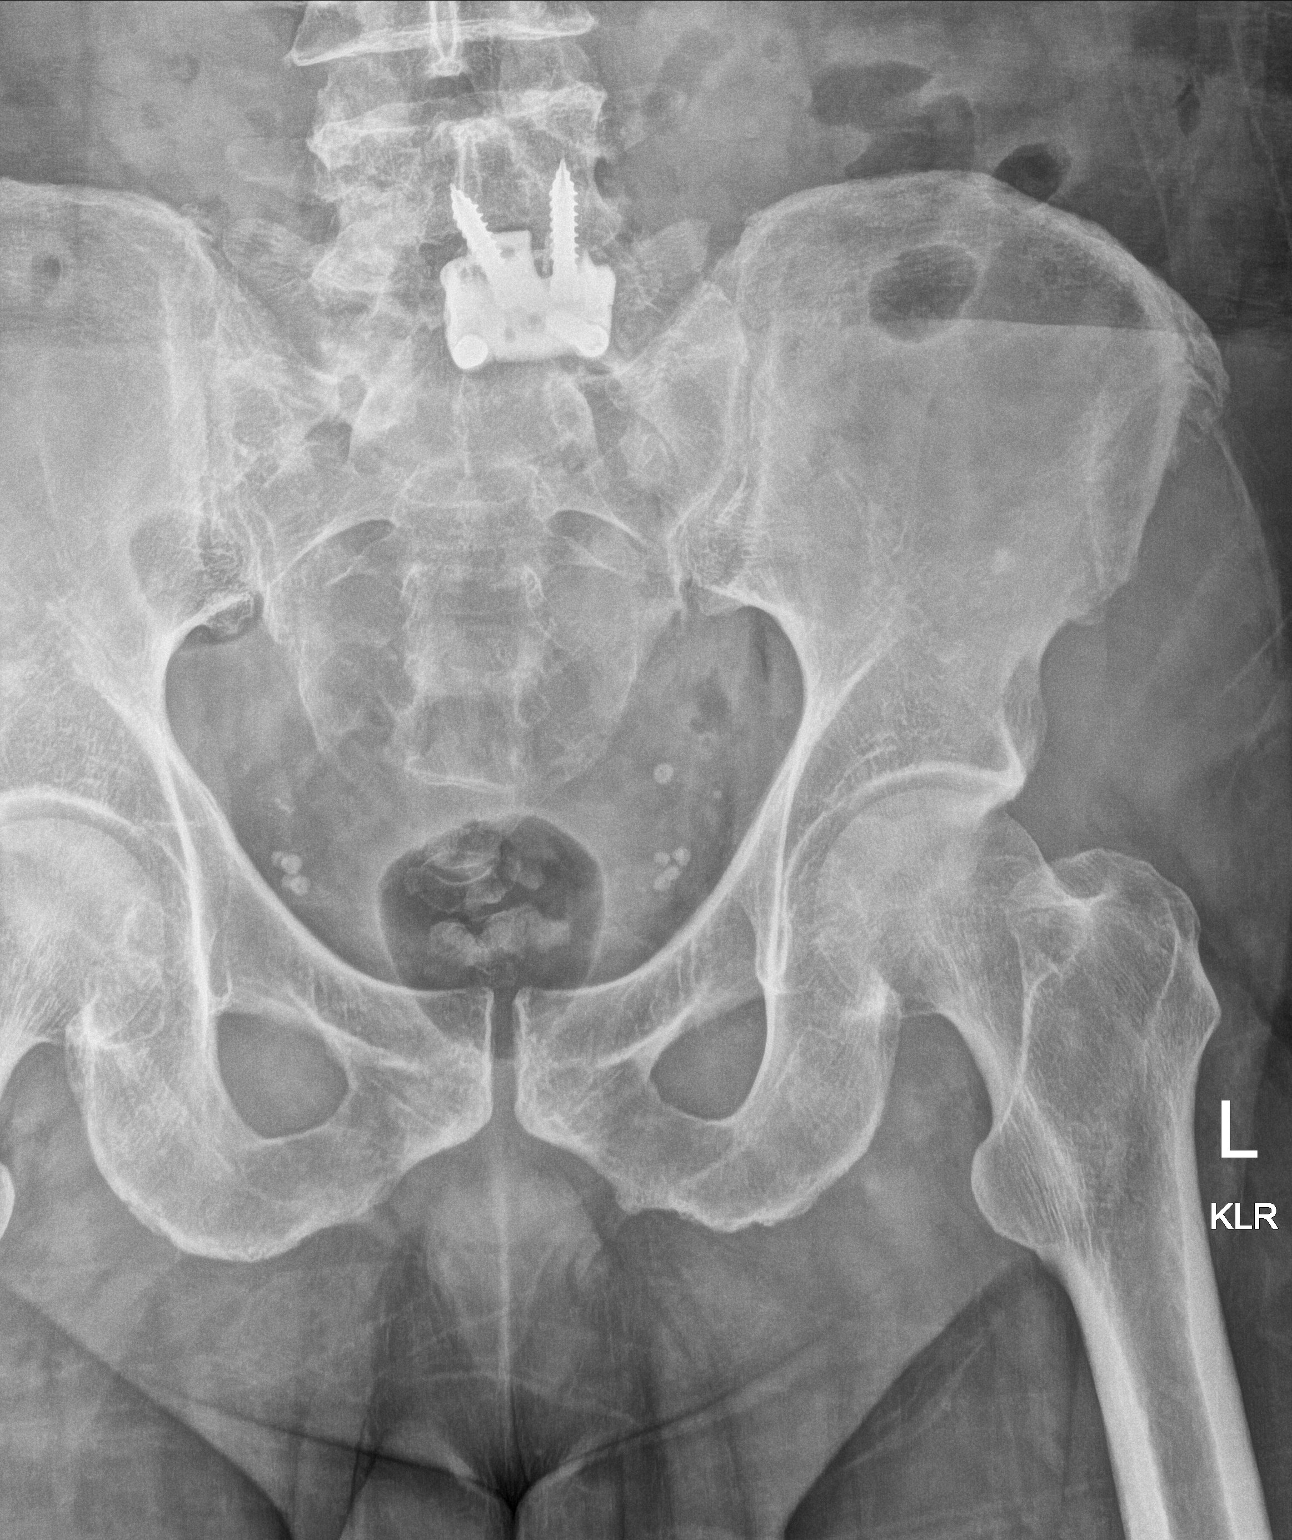

[1 of 1 positions shown; findings below may reference images not displayed]

FINDINGS: No radiopaque foreign body. Fusion hardware in the lower lumbar
spine.
IMPRESSION: No radiopaque foreign body. These results were called by telephone
SANTIGO DUCHARME , who verbally acknowledged these results.

## 2022-10-01 IMAGING — CT DG LUMBAR SPINE 2-3V
4 of 12 series · 7 of 33 positions shown, 8 images · non-contrast
Comparison: None Available.

CLINICAL DATA: Anterior laminectomy and internal fusion

EXAM:
LUMBAR SPINE - 2-3 VIEW

[Series 6: — · coronal · 0.36mm/px · 1 of 168 slices shown (1 of 4)]
[im 84/168  bone]
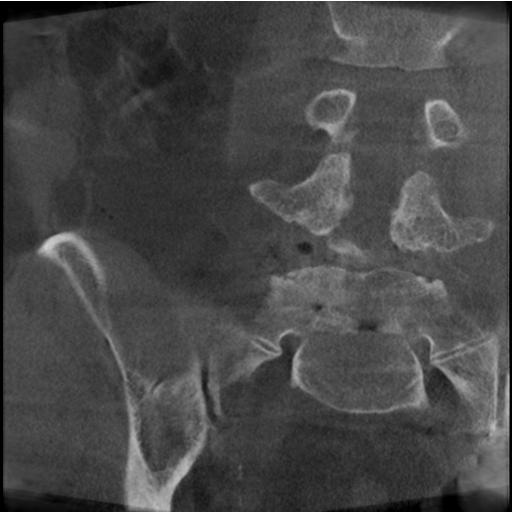

[Series 6: — · sagittal · 0.36mm/px · 2 of 170 slices shown (2 of 4)]
[im 57/170  bone]
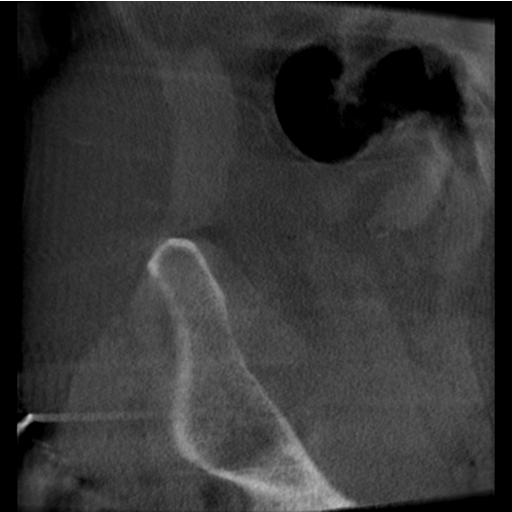
[im 113/170  bone]
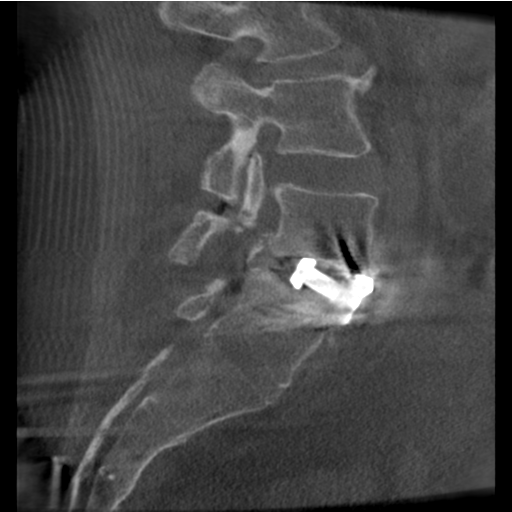

[Series 7: — · axial · 0.36mm/px · z∈[-31,+29]mm · 2 of 170 slices shown (3 of 4)]
[im 57/170  bone]
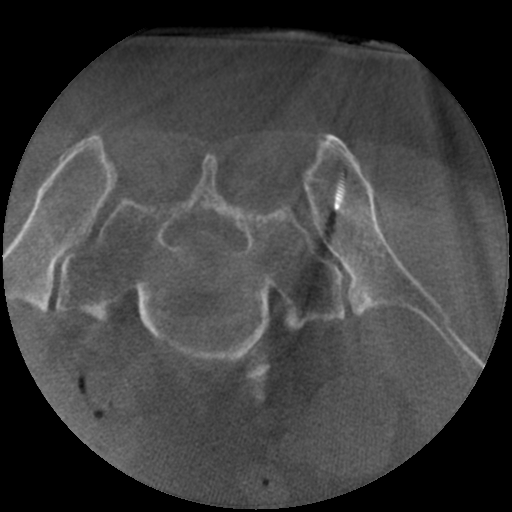
[im 113/170  bone]
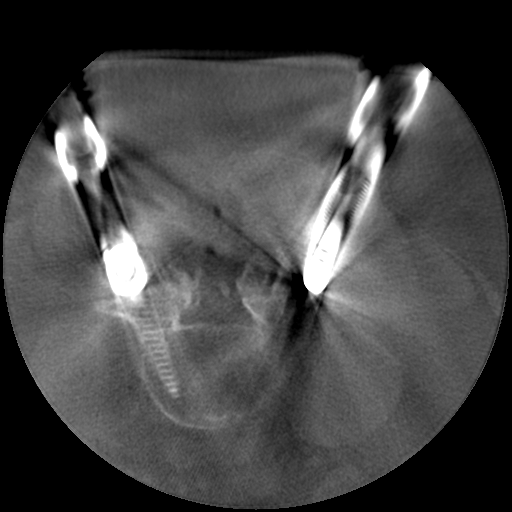

[Series 7: — · axial · 0.36mm/px · z∈[-31,+29]mm · 2 of 170 slices shown, 3 images (4 of 4)]
[im 57/170  soft-tissue]
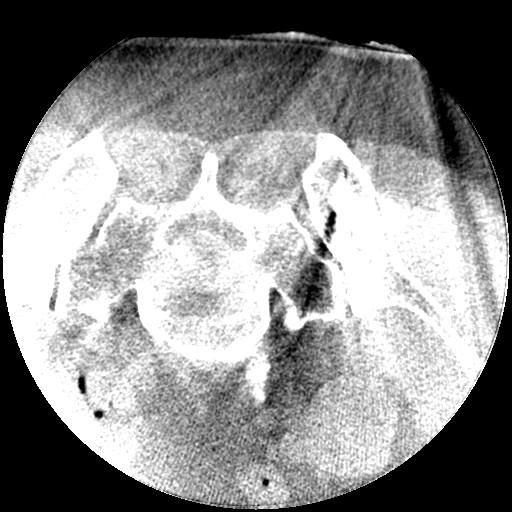
[im 57/170  bone]
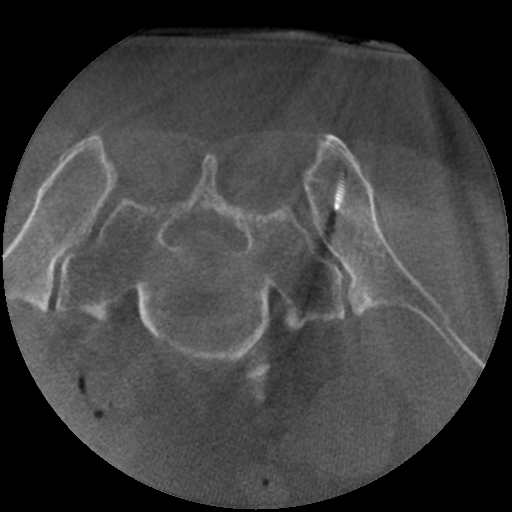
[im 113/170  bone]
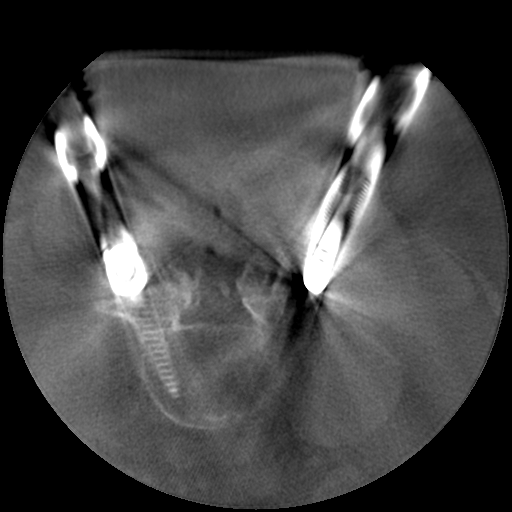

[7 of 33 positions shown; findings below may reference images not displayed]

FINDINGS: A series of 13 spot images of the lumbar spine demonstrates
localization of the L5-S1 level and progressive operative steps of
anterior laminectomy and internal fusion including discectomy,
spacing, and placement of anterior plate and screw fixators.
Subsequent images demonstrate steps and posterolateral rod and
pedicle screw plate fixation at the L4-5 level. No made of
rudimentary disc material at S1-2.
IMPRESSION: 1. Intraoperative fluoroscopic spot images documenting ALIF and
subsequent posterolateral rod and pedicle screw fixation at L5-S1./

## 2024-06-05 ENCOUNTER — Encounter: Payer: Self-pay | Admitting: Ophthalmology

## 2024-06-06 ENCOUNTER — Encounter: Payer: Self-pay | Admitting: Ophthalmology

## 2024-06-06 NOTE — Anesthesia Preprocedure Evaluation (Addendum)
 Anesthesia Evaluation  Patient identified by MRN, date of birth, ID band Patient awake    Reviewed: Allergy & Precautions, H&P , NPO status , Patient's Chart, lab work & pertinent test results  Airway Mallampati: IV  TM Distance: <3 FB Neck ROM: Full    Dental no notable dental hx.    Pulmonary Current Smoker and Patient abstained from smoking.   Pulmonary exam normal breath sounds clear to auscultation       Cardiovascular negative cardio ROS Normal cardiovascular exam Rhythm:Regular Rate:Normal     Neuro/Psych negative neurological ROS  negative psych ROS   GI/Hepatic negative GI ROS, Neg liver ROS,,,  Endo/Other  negative endocrine ROS    Renal/GU negative Renal ROS  negative genitourinary   Musculoskeletal negative musculoskeletal ROS (+)    Abdominal   Peds negative pediatric ROS (+)  Hematology negative hematology ROS (+)   Anesthesia Other Findings   Basal cell carcinoma (BCC) in situ of skin DDD (degenerative disc disease), lumbar Obesity Anterolisthesis  Cancer (HCC) S/P lumbar spinal fusion     Reproductive/Obstetrics negative OB ROS                              Anesthesia Physical Anesthesia Plan  ASA: 2  Anesthesia Plan: MAC   Post-op Pain Management:    Induction: Intravenous  PONV Risk Score and Plan:   Airway Management Planned: Natural Airway and Nasal Cannula  Additional Equipment:   Intra-op Plan:   Post-operative Plan:   Informed Consent: I have reviewed the patients History and Physical, chart, labs and discussed the procedure including the risks, benefits and alternatives for the proposed anesthesia with the patient or authorized representative who has indicated his/her understanding and acceptance.     Dental Advisory Given  Plan Discussed with: Anesthesiologist, CRNA and Surgeon  Anesthesia Plan Comments: (Patient consented for risks of  anesthesia including but not limited to:  - adverse reactions to medications - damage to eyes, teeth, lips or other oral mucosa - nerve damage due to positioning  - sore throat or hoarseness - Damage to heart, brain, nerves, lungs, other parts of body or loss of life  Patient voiced understanding and assent.)         Anesthesia Quick Evaluation

## 2024-06-06 NOTE — Discharge Instructions (Signed)

## 2024-06-07 ENCOUNTER — Ambulatory Visit: Admitting: Anesthesiology

## 2024-06-07 ENCOUNTER — Other Ambulatory Visit: Payer: Self-pay

## 2024-06-07 ENCOUNTER — Encounter: Admission: RE | Disposition: A | Payer: Self-pay | Source: Home / Self Care | Attending: Ophthalmology

## 2024-06-07 ENCOUNTER — Encounter: Payer: Self-pay | Admitting: Ophthalmology

## 2024-06-07 ENCOUNTER — Ambulatory Visit
Admission: RE | Admit: 2024-06-07 | Discharge: 2024-06-07 | Disposition: A | Discharge status: Home / Self Care | Attending: Ophthalmology | Admitting: Ophthalmology

## 2024-06-07 DIAGNOSIS — H5231 Anisometropia: Secondary | ICD-10-CM | POA: Diagnosis present

## 2024-06-07 DIAGNOSIS — F17210 Nicotine dependence, cigarettes, uncomplicated: Secondary | ICD-10-CM | POA: Insufficient documentation

## 2024-06-07 DIAGNOSIS — H2511 Age-related nuclear cataract, right eye: Secondary | ICD-10-CM | POA: Diagnosis present

## 2024-06-07 HISTORY — DX: Malignant (primary) neoplasm, unspecified: C80.1

## 2024-06-07 SURGERY — PHACOEMULSIFICATION, CATARACT, WITH IOL INSERTION
Anesthesia: Monitor Anesthesia Care | Site: Eye | Laterality: Right

## 2024-06-07 MED ORDER — LACTATED RINGERS IV SOLN: INTRAVENOUS | Status: DC

## 2024-06-07 MED ORDER — FENTANYL CITRATE (PF) 100 MCG/2ML IJ SOLN
INTRAMUSCULAR | Status: AC
  Filled 2024-06-07: qty 2

## 2024-06-07 MED ORDER — TETRACAINE HCL 0.5 % OP SOLN
1.0000 [drp] | OPHTHALMIC | Status: DC | PRN
  Administered 2024-06-07 (×3): 1 [drp] via OPHTHALMIC

## 2024-06-07 MED ORDER — PHENYLEPHRINE HCL 10 % OP SOLN
1.0000 [drp] | OPHTHALMIC | Status: AC
  Administered 2024-06-07 (×3): 1 [drp] via OPHTHALMIC

## 2024-06-07 MED ORDER — PHENYLEPHRINE HCL 10 % OP SOLN
OPHTHALMIC | Status: AC
  Filled 2024-06-07: qty 5

## 2024-06-07 MED ORDER — MIDAZOLAM HCL 2 MG/2ML IJ SOLN
INTRAMUSCULAR | Status: AC
  Filled 2024-06-07: qty 2

## 2024-06-07 MED ORDER — CYCLOPENTOLATE HCL 2 % OP SOLN
1.0000 [drp] | OPHTHALMIC | Status: AC
  Administered 2024-06-07 (×3): 1 [drp] via OPHTHALMIC

## 2024-06-07 MED ORDER — CYCLOPENTOLATE HCL 2 % OP SOLN
OPHTHALMIC | Status: AC
  Filled 2024-06-07: qty 2

## 2024-06-07 MED ORDER — MIDAZOLAM HCL 5 MG/5ML IJ SOLN
INTRAMUSCULAR | Status: DC | PRN
  Administered 2024-06-07: 2 mg via INTRAVENOUS

## 2024-06-07 MED ORDER — SIGHTPATH DOSE#1 BSS IO SOLN
INTRAOCULAR | Status: DC | PRN
  Administered 2024-06-07: 15 mL via INTRAOCULAR

## 2024-06-07 MED ORDER — SIGHTPATH DOSE#1 NA HYALUR & NA CHOND-NA HYALUR IO KIT
PACK | INTRAOCULAR | Status: DC | PRN
  Administered 2024-06-07: 1 via OPHTHALMIC

## 2024-06-07 MED ORDER — LIDOCAINE HCL (PF) 2 % IJ SOLN
INTRAOCULAR | Status: DC | PRN
  Administered 2024-06-07: 4 mL via INTRAOCULAR

## 2024-06-07 MED ORDER — TETRACAINE HCL 0.5 % OP SOLN
OPHTHALMIC | Status: AC
  Filled 2024-06-07: qty 4

## 2024-06-07 MED ORDER — ARMC OPHTHALMIC DILATING DROPS: 1.0000 | OPHTHALMIC | Status: DC | PRN

## 2024-06-07 MED ORDER — MOXIFLOXACIN HCL 0.5 % OP SOLN
OPHTHALMIC | Status: DC | PRN
  Administered 2024-06-07: .2 mL via OPHTHALMIC

## 2024-06-07 MED ORDER — BRIMONIDINE TARTRATE-TIMOLOL 0.2-0.5 % OP SOLN
OPHTHALMIC | Status: DC | PRN
  Administered 2024-06-07: 1 [drp] via OPHTHALMIC

## 2024-06-07 MED ORDER — SIGHTPATH DOSE#1 BSS IO SOLN
INTRAOCULAR | Status: DC | PRN
  Administered 2024-06-07: 98 mL via OPHTHALMIC

## 2024-06-07 MED ORDER — FENTANYL CITRATE (PF) 100 MCG/2ML IJ SOLN
INTRAMUSCULAR | Status: DC | PRN
  Administered 2024-06-07: 100 ug via INTRAVENOUS

## 2024-06-07 SURGICAL SUPPLY — 12 items
DISSECTOR HYDRO NUCLEUS 50X22 (MISCELLANEOUS) ×1 IMPLANT
DRSG TEGADERM 2-3/8X2-3/4 SM (GAUZE/BANDAGES/DRESSINGS) ×1 IMPLANT
FEE CATARACT SUITE SIGHTPATH (MISCELLANEOUS) ×1 IMPLANT
FORCEPS MICRO-HOLDING 23GA (INSTRUMENTS) IMPLANT
GLOVE BIOGEL PI IND STRL 8 (GLOVE) ×1 IMPLANT
GLOVE SURG LX STRL 7.5 STRW (GLOVE) ×1 IMPLANT
GLOVE SURG SYN 6.5 PF PI BL (GLOVE) ×1 IMPLANT
LENS IOL TECNIS MONO 12.0 (Intraocular Lens) IMPLANT
NDL FILTER BLUNT 18X1 1/2 (NEEDLE) ×1 IMPLANT
NEEDLE FILTER BLUNT 18X1 1/2 (NEEDLE) ×1 IMPLANT
SET MST INSTRUMENTS (MISCELLANEOUS) IMPLANT
SYR 3ML LL SCALE MARK (SYRINGE) ×1 IMPLANT

## 2024-06-07 NOTE — H&P (Signed)
 [facility]   Primary Care Physician:  Cletus Glenn Ophthalmologist: Dr. Feliciano Ober  Pre-Procedure History & Physical: HPI:  William Gutierrez is a 63 y.o. male here for cataract surgery.   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   DDD (degenerative disc disease), lumbar    ED (erectile dysfunction)    Obesity    S/P lumbar spinal fusion 12/23/2021    Past Surgical History:  Procedure Laterality Date   ABDOMINAL EXPOSURE N/A 12/23/2021   Procedure: ABDOMINAL EXPOSURE;  Surgeon: Marea Selinda RAMAN, MD;  Location: [facility] ORS;  Service: Vascular;  Laterality: N/A;   ANTERIOR LUMBAR FUSION N/A 12/23/2021   Procedure: ANTERIOR LUMBAR FUSION 1 LEVEL L5-S1, POSTERIOR SPINAL FUSION L5-S1;  Surgeon: Clois Fret, MD;  Location: [facility] ORS;  Service: Neurosurgery;  Laterality: N/A;   APPLICATION OF INTRAOPERATIVE CT SCAN N/A 12/23/2021   Procedure: APPLICATION OF INTRAOPERATIVE CT SCAN;  Surgeon: Clois Fret, MD;  Location: [facility] ORS;  Service: Neurosurgery;  Laterality: N/A;   MELANOMA EXCISION  07/2013   VASECTOMY  1996    Prior to Admission medications   Medication Sig Start Date End Date Taking? Authorizing Provider  B Complex Vitamins (VITAMIN B COMPLEX) TABS Take 1 tablet by mouth daily.   Yes [provider]  fluticasone (FLONASE) 50 MCG/ACT nasal spray Place 2 sprays into both nostrils daily.   Yes [provider]  pseudoephedrine (SUDAFED) 30 MG tablet Take 30 mg by mouth at bedtime as needed for congestion.   Yes [provider]  sildenafil (REVATIO) 20 MG tablet Take 20 mg by mouth as needed.   Yes [provider]  Ascorbic Acid (VITAMIN C PO) Take 1 tablet by mouth every Monday, Wednesday, and Friday.    [provider]  CALCIUM PO Take by mouth daily.    [provider]  Ferrous Sulfate (IRON PO) Take by  mouth daily.    [provider]  Multiple Vitamins-Minerals (MULTIVITAMIN ADULTS 50+) TABS Take by mouth daily.    [provider]  Omega-3 Fatty Acids (OMEGA-3 FISH OIL PO) Take by mouth daily.    [provider]  senna (SENOKOT) 8.6 MG TABS tablet Take 1 tablet (8.6 mg total) by mouth daily as needed for mild constipation. 12/25/21   Gregory Edsel Ruth, PA    Allergies as of 05/02/2024   (No Known Allergies)    Family History  Problem Relation Age of Onset   Hypertension Mother    Diabetes Father        type 2   Cancer Brother     Social History   Socioeconomic History   Marital status: Married    Spouse name: Sonya   Number of children: 3   Years of education: Not on file   Highest education level: Not on file  Occupational History   Not on file  Tobacco Use   Smoking status: Some Days    Types: Cigarettes   Smokeless tobacco: Never  Vaping Use   Vaping status: Never Used  Substance and Sexual Activity   Alcohol use: Yes    Comment: occasional 2-3 beers in a week   Drug use: Yes    Types: Marijuana    Comment: in college   Sexual activity: Not on file  Other Topics Concern   Not on file  Social History  Narrative   Not on file   Social Drivers of Health   Financial Resource Strain: Low Risk  (09/09/2022)   Received from [facility]   Overall Financial Resource Strain (CARDIA)    Difficulty of Paying Living Expenses: Not hard at all  Food Insecurity: No Food Insecurity (09/09/2022)   Received from [facility]   Hunger Vital Sign    Within the past 12 months, you worried that your food would run out before you got the money to buy more.: Never true    Within the past 12 months, the food you bought just didn't last and you didn't have money to get more.: Never true  Transportation Needs: No Transportation Needs (09/09/2022)   Received from [facility] - Transportation    In  the past 12 months, has lack of transportation kept you from medical appointments or from getting medications?: No    Lack of Transportation (Non-Medical): No  Physical Activity: Not on file  Stress: Not on file  Social Connections: Not on file  Intimate Partner Violence: Not on file    Review of Systems: See HPI, otherwise negative ROS  Physical Exam: Ht 5' 6 (1.676 m)   Wt 102.1 kg   BMI 36.32 kg/m  General:   Alert, cooperative in NAD Head:  Normocephalic and atraumatic. Respiratory:  Normal work of breathing. Cardiovascular:  RRR  Impression/Plan: William Gutierrez is here for cataract surgery.  Risks, benefits, limitations, and alternatives regarding cataract surgery have been reviewed with the patient.  Questions have been answered.  All parties agreeable.   Feliciano Bryan Ober, MD  06/07/2024, 7:11 AM

## 2024-06-07 NOTE — Transfer of Care (Signed)
 Immediate Anesthesia Transfer of Care Note  Patient: William Gutierrez  Procedure(s) Performed: PHACOEMULSIFICATION, CATARACT, WITH IOL INSERTION 10.57 01:04.9 (Right: Eye)  Patient Location: PACU  Anesthesia Type: MAC  Level of Consciousness: awake, alert  and patient cooperative  Airway and Oxygen Therapy: Patient Spontanous Breathing and Patient connected to supplemental oxygen  Post-op Assessment: Post-op Vital signs reviewed, Patient's Cardiovascular Status Stable, Respiratory Function Stable, Patent Airway and No signs of Nausea or vomiting  Post-op Vital Signs: Reviewed and stable  Complications: No notable events documented.

## 2024-06-07 NOTE — Anesthesia Postprocedure Evaluation (Signed)
 Anesthesia Post Note  Patient: William Gutierrez  Procedure(s) Performed: PHACOEMULSIFICATION, CATARACT, WITH IOL INSERTION 10.57 01:04.9 (Right: Eye)  Patient location during evaluation: PACU Anesthesia Type: MAC Level of consciousness: awake and alert Pain management: pain level controlled Vital Signs Assessment: post-procedure vital signs reviewed and stable Respiratory status: spontaneous breathing, nonlabored ventilation, respiratory function stable and patient connected to nasal cannula oxygen Cardiovascular status: stable and blood pressure returned to baseline Postop Assessment: no apparent nausea or vomiting Anesthetic complications: no   No notable events documented.   Last Vitals:  Vitals:   06/07/24 1037 06/07/24 1042  BP: 117/81 119/85  Pulse: 70 70  Resp: 13 17  Temp: (!) 36.1 C (!) 36.1 C  SpO2: 98% 97%    Last Pain:  Vitals:   06/07/24 1042  TempSrc:   PainSc: 0-No pain                 [name] C [name]

## 2024-06-07 NOTE — Op Note (Signed)
 OPERATIVE NOTE  William Gutierrez [id number] 06/07/2024   PREOPERATIVE DIAGNOSIS: Nuclear sclerotic cataract right eye. H25.11   POSTOPERATIVE DIAGNOSIS: Nuclear sclerotic cataract right eye. H25.11   PROCEDURE:  Phacoemulsification with posterior chamber intraocular lens placement of the right eye  Ultrasound time: Procedure(s): PHACOEMULSIFICATION, CATARACT, WITH IOL INSERTION 10.57 01:04.9 (Right)  LENS:   Implant Name Type Inv. Item Serial No. Manufacturer Lot No. LRB No. Used Action  LENS IOL TECNIS MONO 12.0 - [id number] Intraocular Lens LENS IOL TECNIS MONO 12.0 [id number] [employer]  Right 1 Implanted and Explanted  LENS IOL TECNIS MONO 12.0 - [id number] Intraocular Lens LENS IOL TECNIS MONO 12.0 [id number] [employer]  Right 1 Implanted      SURGEON:  Feliciano HERO. Enola, MD   ANESTHESIA:  Topical with tetracaine drops, augmented with 1% preservative-free intracameral lidocaine .   COMPLICATIONS:  None.   DESCRIPTION OF PROCEDURE:  The patient was identified in the holding room and transported to the operating room and placed in the supine position under the operating microscope.  The right eye was identified as the operative eye, which was prepped and draped in the usual sterile ophthalmic fashion.   A 1 millimeter clear-corneal paracentesis was made superotemporally. Preservative-free 1% lidocaine  mixed with 1:1,000 bisulfite-free aqueous solution of epinephrine  was injected into the anterior chamber. The anterior chamber was then filled with Viscoat viscoelastic. A 2.4 millimeter keratome was used to make a clear-corneal incision inferotemporally. A curvilinear capsulorrhexis was made with a cystotome and capsulorrhexis forceps. Balanced salt solution was used to hydrodissect and hydrodelineate the nucleus. Phacoemulsification was then used to remove the lens nucleus and epinucleus. The remaining cortex was then removed using the irrigation and aspiration handpiece. Provisc was then  placed into the capsular bag to distend it for lens placement. A +12.00 D DCB00 intraocular lens was then injected into the capsular bag.  A crack in the lens implant was noted centrally, so lens cutting scissors were used to bisect the implant, and the two halves were removed through the main incision using lens grasping forceps. A new +12.00 D DCB00 intraocular lens was injected in the capsular bag - no defects were appreciated. The remaining viscoelastic was aspirated.   Wounds were hydrated with balanced salt solution.  The anterior chamber was inflated to a physiologic pressure with balanced salt solution.  No wound leaks were noted. Moxifloxacin was injected intracamerally.  Timolol and Brimonidine drops were applied to the eye.  The patient was taken to the recovery room in stable condition without complications of anesthesia or surgery.  Feliciano Hugger [employer] [place] 06/07/2024, 10:34 AM

## 2024-06-08 ENCOUNTER — Encounter: Payer: Self-pay | Admitting: Ophthalmology

## 2024-06-12 NOTE — Discharge Instructions (Signed)

## 2024-06-13 NOTE — Anesthesia Preprocedure Evaluation (Signed)
 Anesthesia Evaluation  Patient identified by MRN, date of birth, ID band Patient awake    Reviewed: Allergy & Precautions, H&P , NPO status , Patient's Chart, lab work & pertinent test results  Airway Mallampati: IV  TM Distance: <3 FB Neck ROM: Full    Dental no notable dental hx.    Pulmonary neg pulmonary ROS, Current Smoker and Patient abstained from smoking.   Pulmonary exam normal breath sounds clear to auscultation       Cardiovascular negative cardio ROS Normal cardiovascular exam Rhythm:Regular Rate:Normal     Neuro/Psych negative neurological ROS  negative psych ROS   GI/Hepatic negative GI ROS, Neg liver ROS,,,  Endo/Other  negative endocrine ROS    Renal/GU negative Renal ROS  negative genitourinary   Musculoskeletal negative musculoskeletal ROS (+)    Abdominal   Peds negative pediatric ROS (+)  Hematology negative hematology ROS (+)   Anesthesia Other Findings Previous cataract surgery 05-07-24 Dr. Ola  Basal cell carcinoma [facility]) in situ of skin DDD (degenerative disc disease), lumbar Obesity Anterolisthesis             Cancer (HCC) S/P lumbar spinal fusion        Reproductive/Obstetrics negative OB ROS                              Anesthesia Physical Anesthesia Plan  ASA: 2  Anesthesia Plan: MAC   Post-op Pain Management:    Induction: Intravenous  PONV Risk Score and Plan:   Airway Management Planned: Natural Airway and Nasal Cannula  Additional Equipment:   Intra-op Plan:   Post-operative Plan:   Informed Consent: I have reviewed the patients History and Physical, chart, labs and discussed the procedure including the risks, benefits and alternatives for the proposed anesthesia with the patient or authorized representative who has indicated his/her understanding and acceptance.     Dental Advisory Given  Plan Discussed with: Anesthesiologist,  CRNA and Surgeon  Anesthesia Plan Comments: (Patient consented for risks of anesthesia including but not limited to:  - adverse reactions to medications - damage to eyes, teeth, lips or other oral mucosa - nerve damage due to positioning  - sore throat or hoarseness - Damage to heart, brain, nerves, lungs, other parts of body or loss of life  Patient voiced understanding and assent.)         Anesthesia Quick Evaluation

## 2024-06-14 ENCOUNTER — Ambulatory Visit
Admission: RE | Admit: 2024-06-14 | Discharge: 2024-06-14 | Disposition: A | Discharge status: Home / Self Care | Attending: Ophthalmology | Admitting: Ophthalmology

## 2024-06-14 ENCOUNTER — Other Ambulatory Visit: Payer: Self-pay

## 2024-06-14 ENCOUNTER — Encounter
Admission: RE | Disposition: A | Discharge status: Home / Self Care | Payer: Self-pay | Source: Home / Self Care | Attending: Ophthalmology

## 2024-06-14 ENCOUNTER — Ambulatory Visit: Payer: Self-pay | Admitting: Anesthesiology

## 2024-06-14 ENCOUNTER — Encounter: Payer: Self-pay | Admitting: Ophthalmology

## 2024-06-14 DIAGNOSIS — H2512 Age-related nuclear cataract, left eye: Secondary | ICD-10-CM | POA: Diagnosis present

## 2024-06-14 DIAGNOSIS — F17210 Nicotine dependence, cigarettes, uncomplicated: Secondary | ICD-10-CM | POA: Insufficient documentation

## 2024-06-14 DIAGNOSIS — Z9841 Cataract extraction status, right eye: Secondary | ICD-10-CM | POA: Diagnosis not present

## 2024-06-14 DIAGNOSIS — Z961 Presence of intraocular lens: Secondary | ICD-10-CM | POA: Diagnosis not present

## 2024-06-14 DIAGNOSIS — Z79899 Other long term (current) drug therapy: Secondary | ICD-10-CM | POA: Insufficient documentation

## 2024-06-14 SURGERY — PHACOEMULSIFICATION, CATARACT, WITH IOL INSERTION
Anesthesia: Monitor Anesthesia Care | Site: Eye | Laterality: Left

## 2024-06-14 MED ORDER — PHENYLEPHRINE HCL 10 % OP SOLN
OPHTHALMIC | Status: AC
  Filled 2024-06-14: qty 5

## 2024-06-14 MED ORDER — PHENYLEPHRINE HCL 10 % OP SOLN
1.0000 [drp] | OPHTHALMIC | Status: AC
  Administered 2024-06-14 (×3): 1 [drp] via OPHTHALMIC

## 2024-06-14 MED ORDER — FENTANYL CITRATE (PF) 100 MCG/2ML IJ SOLN
INTRAMUSCULAR | Status: DC | PRN
  Administered 2024-06-14: 50 ug via INTRAVENOUS

## 2024-06-14 MED ORDER — MIDAZOLAM HCL (PF) 2 MG/2ML IJ SOLN
INTRAMUSCULAR | Status: DC | PRN
  Administered 2024-06-14: 2 mg via INTRAVENOUS

## 2024-06-14 MED ORDER — LIDOCAINE HCL (PF) 2 % IJ SOLN
INTRAOCULAR | Status: DC | PRN
  Administered 2024-06-14: 4 mL via INTRAOCULAR

## 2024-06-14 MED ORDER — SIGHTPATH DOSE#1 BSS IO SOLN
INTRAOCULAR | Status: DC | PRN
  Administered 2024-06-14: 68 mL via OPHTHALMIC

## 2024-06-14 MED ORDER — BRIMONIDINE TARTRATE-TIMOLOL 0.2-0.5 % OP SOLN
OPHTHALMIC | Status: DC | PRN
  Administered 2024-06-14: 1 [drp] via OPHTHALMIC

## 2024-06-14 MED ORDER — FENTANYL CITRATE (PF) 100 MCG/2ML IJ SOLN
INTRAMUSCULAR | Status: AC
  Filled 2024-06-14: qty 2

## 2024-06-14 MED ORDER — SIGHTPATH DOSE#1 NA HYALUR & NA CHOND-NA HYALUR IO KIT
PACK | INTRAOCULAR | Status: DC | PRN
  Administered 2024-06-14: 1 via OPHTHALMIC

## 2024-06-14 MED ORDER — MIDAZOLAM HCL 2 MG/2ML IJ SOLN
INTRAMUSCULAR | Status: AC
  Filled 2024-06-14: qty 2

## 2024-06-14 MED ORDER — SIGHTPATH DOSE#1 BSS IO SOLN
INTRAOCULAR | Status: DC | PRN
  Administered 2024-06-14: 15 mL via INTRAOCULAR

## 2024-06-14 MED ORDER — CYCLOPENTOLATE HCL 2 % OP SOLN
1.0000 [drp] | OPHTHALMIC | Status: AC
  Administered 2024-06-14 (×3): 1 [drp] via OPHTHALMIC

## 2024-06-14 MED ORDER — MOXIFLOXACIN HCL 0.5 % OP SOLN
OPHTHALMIC | Status: DC | PRN
  Administered 2024-06-14: .2 mL via OPHTHALMIC

## 2024-06-14 MED ORDER — CYCLOPENTOLATE HCL 2 % OP SOLN
OPHTHALMIC | Status: AC
  Filled 2024-06-14: qty 2

## 2024-06-14 MED ORDER — LACTATED RINGERS IV SOLN: INTRAVENOUS | Status: DC

## 2024-06-14 MED ORDER — TETRACAINE HCL 0.5 % OP SOLN
1.0000 [drp] | OPHTHALMIC | Status: DC | PRN
Start: 2024-06-14 — End: 2024-06-14
  Administered 2024-06-14 (×3): 1 [drp] via OPHTHALMIC

## 2024-06-14 MED ORDER — TETRACAINE HCL 0.5 % OP SOLN
OPHTHALMIC | Status: AC
  Filled 2024-06-14: qty 4

## 2024-06-14 SURGICAL SUPPLY — 10 items
DISSECTOR HYDRO NUCLEUS 50X22 (MISCELLANEOUS) ×1 IMPLANT
DRSG TEGADERM 2-3/8X2-3/4 SM (GAUZE/BANDAGES/DRESSINGS) ×1 IMPLANT
FEE CATARACT SUITE SIGHTPATH (MISCELLANEOUS) ×1 IMPLANT
GLOVE BIOGEL PI IND STRL 8 (GLOVE) ×1 IMPLANT
GLOVE SURG LX STRL 7.5 STRW (GLOVE) ×1 IMPLANT
GLOVE SURG SYN 6.5 PF PI BL (GLOVE) ×1 IMPLANT
LENS IOL TECNIS MONO 12.5 (Intraocular Lens) IMPLANT
NDL FILTER BLUNT 18X1 1/2 (NEEDLE) ×1 IMPLANT
NEEDLE FILTER BLUNT 18X1 1/2 (NEEDLE) ×1 IMPLANT
SYR 3ML LL SCALE MARK (SYRINGE) ×1 IMPLANT

## 2024-06-14 NOTE — Op Note (Signed)
 OPERATIVE NOTE  Micai Apolinar [id number] 06/14/2024   PREOPERATIVE DIAGNOSIS: Nuclear sclerotic cataract left eye. H25.12   POSTOPERATIVE DIAGNOSIS: Nuclear sclerotic cataract left eye. H25.12   PROCEDURE:  Phacoemulsification with posterior chamber intraocular lens placement of the left eye  Ultrasound time: Procedure(s): PHACOEMULSIFICATION, CATARACT, WITH IOL INSERTION 7.25 00:46.3 (Left)  LENS:   Implant Name Type Inv. Item Serial No. Manufacturer Lot No. LRB No. Used Action  LENS IOL TECNIS MONO 12.5 - [id number] Intraocular Lens LENS IOL TECNIS MONO 12.5 [id number] [employer]  Left 1 Implanted      SURGEON:  Feliciano HERO. Enola, MD   ANESTHESIA:  Topical with tetracaine drops, augmented with 1% preservative-free intracameral lidocaine .   COMPLICATIONS:  None.   DESCRIPTION OF PROCEDURE:  The patient was identified in the holding room and transported to the operating room and placed in the supine position under the operating microscope.  The left eye was identified as the operative eye, which was prepped and draped in the usual sterile ophthalmic fashion.   A 1 millimeter clear-corneal paracentesis was made inferotemporally. Preservative-free 1% lidocaine  mixed with 1:1,000 bisulfite-free aqueous solution of epinephrine  was injected into the anterior chamber. The anterior chamber was then filled with Viscoat viscoelastic. A 2.4 millimeter keratome was used to make a clear-corneal incision superotemporally. A curvilinear capsulorrhexis was made with a cystotome and capsulorrhexis forceps. Balanced salt solution was used to hydrodissect and hydrodelineate the nucleus. Phacoemulsification was then used to remove the lens nucleus and epinucleus. The remaining cortex was then removed using the irrigation and aspiration handpiece. Provisc was then placed into the capsular bag to distend it for lens placement. A +12.50 D DCB00 intraocular lens was then injected into the capsular bag. The  remaining viscoelastic was aspirated.   Wounds were hydrated with balanced salt solution.  The anterior chamber was inflated to a physiologic pressure with balanced salt solution.  No wound leaks were noted. Moxifloxacin was injected intracamerally.  Timolol and Brimonidine drops were applied to the eye.  The patient was taken to the recovery room in stable condition without complications of anesthesia or surgery.  [employer] 06/14/2024, 10:39 AM

## 2024-06-14 NOTE — Transfer of Care (Signed)
 Immediate Anesthesia Transfer of Care Note  Patient: William Gutierrez  Procedure(s) Performed: PHACOEMULSIFICATION, CATARACT, WITH IOL INSERTION 7.25 00:46.3 (Left: Eye)  Patient Location: PACU  Anesthesia Type: MAC  Level of Consciousness: awake, alert  and patient cooperative  Airway and Oxygen Therapy: Patient Spontanous Breathing   Post-op Assessment: Post-op Vital signs reviewed, Patient's Cardiovascular Status Stable, Respiratory Function Stable, Patent Airway and No signs of Nausea or vomiting  Post-op Vital Signs: Reviewed and stable  Complications: No notable events documented.

## 2024-06-14 NOTE — Anesthesia Postprocedure Evaluation (Signed)
 Anesthesia Post Note  Patient: William Gutierrez  Procedure(s) Performed: PHACOEMULSIFICATION, CATARACT, WITH IOL INSERTION 7.25 00:46.3 (Left: Eye)  Patient location during evaluation: PACU Anesthesia Type: MAC Level of consciousness: awake and alert Pain management: pain level controlled Vital Signs Assessment: post-procedure vital signs reviewed and stable Respiratory status: spontaneous breathing, nonlabored ventilation, respiratory function stable and patient connected to nasal cannula oxygen Cardiovascular status: stable and blood pressure returned to baseline Postop Assessment: no apparent nausea or vomiting Anesthetic complications: no   No notable events documented.   Last Vitals:  Vitals:   06/14/24 1040 06/14/24 1045  BP: 111/77 100/76  Pulse: 71 71  Resp: 17 14  Temp: 36.7 C 36.7 C  SpO2: 94% 95%    Last Pain:  Vitals:   06/14/24 1045  TempSrc:   PainSc: 0-No pain                 Tatumn Corbridge C Fatoumata Albaugh

## 2024-06-14 NOTE — H&P (Signed)
 [facility]   Primary Care Physician:  Cletus Glenn Ophthalmologist: Dr. Feliciano Ober  Pre-Procedure History & Physical: HPI:  William Gutierrez is a 63 y.o. male here for cataract surgery.   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   DDD (degenerative disc disease), lumbar    ED (erectile dysfunction)    Obesity    S/P lumbar spinal fusion 12/23/2021    Past Surgical History:  Procedure Laterality Date   ABDOMINAL EXPOSURE N/A 12/23/2021   Procedure: ABDOMINAL EXPOSURE;  Surgeon: Marea Selinda RAMAN, MD;  Location: [facility] ORS;  Service: Vascular;  Laterality: N/A;   ANTERIOR LUMBAR FUSION N/A 12/23/2021   Procedure: ANTERIOR LUMBAR FUSION 1 LEVEL L5-S1, POSTERIOR SPINAL FUSION L5-S1;  Surgeon: Clois Fret, MD;  Location: [facility] ORS;  Service: Neurosurgery;  Laterality: N/A;   APPLICATION OF INTRAOPERATIVE CT SCAN N/A 12/23/2021   Procedure: APPLICATION OF INTRAOPERATIVE CT SCAN;  Surgeon: Clois Fret, MD;  Location: [facility] ORS;  Service: Neurosurgery;  Laterality: N/A;   CATARACT EXTRACTION W/PHACO Right 06/07/2024   Procedure: PHACOEMULSIFICATION, CATARACT, WITH IOL INSERTION 10.57 01:04.9;  Surgeon: Ober Feliciano Hugger, MD;  Location: [facility];  Service: Ophthalmology;  Laterality: Right;   MELANOMA EXCISION  07/2013   VASECTOMY  1996    Prior to Admission medications   Medication Sig Start Date End Date Taking? Authorizing Provider  Ascorbic Acid (VITAMIN C PO) Take 1 tablet by mouth every Monday, Wednesday, and Friday.    [provider]  B Complex Vitamins (VITAMIN B COMPLEX) TABS Take 1 tablet by mouth daily.    [provider]  CALCIUM PO Take by mouth daily.    [provider]  Ferrous Sulfate (IRON PO) Take by mouth daily.    [provider]  fluticasone (FLONASE) 50 MCG/ACT nasal spray Place 2 sprays into  both nostrils daily.    [provider]  Multiple Vitamins-Minerals (MULTIVITAMIN ADULTS 50+) TABS Take by mouth daily.    [provider]  Omega-3 Fatty Acids (OMEGA-3 FISH OIL PO) Take by mouth daily.    [provider]  pseudoephedrine (SUDAFED) 30 MG tablet Take 30 mg by mouth at bedtime as needed for congestion.    [provider]  senna (SENOKOT) 8.6 MG TABS tablet Take 1 tablet (8.6 mg total) by mouth daily as needed for mild constipation. 12/25/21   Gregory Edsel Ruth, PA  sildenafil (REVATIO) 20 MG tablet Take 20 mg by mouth as needed.    [provider]    Allergies as of 05/02/2024   (No Known Allergies)    Family History  Problem Relation Age of Onset   Hypertension Mother    Diabetes Father        type 2   Cancer Brother     Social History   Socioeconomic History   Marital status: Married    Spouse name: Sonya   Number of children: 3   Years of education: Not on file   Highest education level: Not on file  Occupational History   Not on file  Tobacco Use   Smoking status: Some Days    Types: Cigarettes   Smokeless tobacco: Never  Vaping Use   Vaping status: Never Used  Substance and Sexual Activity   Alcohol use: Yes    Comment: occasional 2-3 beers in a week  Drug use: Yes    Types: Marijuana    Comment: in college   Sexual activity: Not on file  Other Topics Concern   Not on file  Social History Narrative   Not on file   Social Drivers of Health   Financial Resource Strain: Low Risk  (09/09/2022)   Received [facility]   Overall Financial Resource Strain (CARDIA)    Difficulty of Paying Living Expenses: Not hard at all  Food Insecurity: No Food Insecurity (09/09/2022)   Received from [facility]   Hunger Vital Sign    Within the past 12 months, you worried that your food would run out before you got the money to buy more.: Never true    Within the past 12 months,  the food you bought just didn't last and you didn't have money to get more.: Never true  Transportation Needs: No Transportation Needs (09/09/2022)   Received from [facility]    In the past 12 months, has lack of transportation kept you from medical appointments or from getting medications?: No    Lack of Transportation (Non-Medical): No  Physical Activity: Not on file  Stress: Not on file  Social Connections: Not on file  Intimate Partner Violence: Not on file    Review of Systems: See HPI, otherwise negative ROS  Physical Exam: There were no vitals taken for this visit. General:   Alert, cooperative in NAD Head:  Normocephalic and atraumatic. Respiratory:  Normal work of breathing. Cardiovascular:  RRR  Impression/Plan: William Gutierrez is here for cataract surgery.  Risks, benefits, limitations, and alternatives regarding cataract surgery have been reviewed with the patient.  Questions have been answered.  All parties agreeable.   Feliciano Bryan Ober, MD  06/14/2024, 7:24 AM

## 2024-08-31 ENCOUNTER — Other Ambulatory Visit: Payer: Self-pay

## 2024-08-31 ENCOUNTER — Ambulatory Visit: Payer: Self-pay | Admitting: Adult Health

## 2024-08-31 ENCOUNTER — Encounter: Payer: Self-pay | Admitting: Adult Health

## 2024-08-31 VITALS — BP 156/100 | HR 85 | Temp 97.6°F | Ht 66.0 in | Wt 228.0 lb

## 2024-08-31 DIAGNOSIS — H1031 Unspecified acute conjunctivitis, right eye: Secondary | ICD-10-CM

## 2024-08-31 MED ORDER — GENTAMICIN SULFATE 0.3 % OP SOLN: 1.0000 [drp] | OPHTHALMIC | 0 refills | Status: AC

## 2024-08-31 NOTE — Progress Notes (Signed)
 Therapist, [facility] [address] [place], [place] [address]   Office Visit Note  Patient Name: William Gutierrez Date of Birth [id number]  Medical Record number [id number]  Date of Service: 08/31/2024  Chief Complaint  Patient presents with   Acute Visit    R eye redness and drainage with irritation to the skin around the eye     HPI Pt is here for a sick visit. He reports this morning his right eye was drippy, which isn't unusual.  However, someone at work pointed out that his right eye is red.  Denies itching or discharge at this time.    Current Medication:  Outpatient Encounter Medications as of 08/31/2024  Medication Sig   Ascorbic Acid (VITAMIN C PO) Take by mouth daily.   B Complex Vitamins (VITAMIN B COMPLEX) TABS Take 1 tablet by mouth daily.   Ferrous Sulfate (IRON PO) Take by mouth daily.   fluticasone (FLONASE) 50 MCG/ACT nasal spray Place 2 sprays into both nostrils daily.   gentamicin  (GARAMYCIN ) 0.3 % ophthalmic solution Place 1 drop into both eyes every 4 (four) hours.   Multiple Vitamins-Minerals (MULTIVITAMIN ADULTS 50+) TABS Take by mouth daily.   Omega-3 Fatty Acids (OMEGA-3 FISH OIL PO) Take by mouth daily.   pseudoephedrine (SUDAFED) 30 MG tablet Take 30 mg by mouth at bedtime as needed for congestion.   senna (SENOKOT) 8.6 MG TABS tablet Take 1 tablet (8.6 mg total) by mouth daily as needed for mild constipation.   tadalafil (CIALIS) 10 MG tablet Take 10 mg by mouth daily as needed for erectile dysfunction.   [DISCONTINUED] Ascorbic Acid (VITAMIN C PO) Take 1 tablet by mouth every Monday, Wednesday, and Friday. (Patient taking differently: Take 1 tablet by mouth daily.)   [DISCONTINUED] CALCIUM PO Take by mouth daily.   [DISCONTINUED] Ferrous Gluconate-C-Folic Acid (IRON-C PO) Take by mouth daily.   [DISCONTINUED] sildenafil (REVATIO) 20 MG tablet Take 20 mg by mouth as needed.   No facility-administered encounter medications on file as of 08/31/2024.       Medical History: 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   DDD (degenerative disc disease), lumbar    ED (erectile dysfunction)    Obesity    S/P lumbar spinal fusion 12/23/2021     Vital Signs: BP (!) 156/100   Pulse 85   Temp 97.6 F (36.4 C)   Ht 5' 6 (1.676 m)   Wt 228 lb (103.4 kg)   SpO2 98%   BMI 36.80 kg/m    Review of Systems  Constitutional:  Negative for chills, fatigue and fever.  Eyes:  Positive for discharge and redness.    Physical Exam Vitals reviewed.  Eyes:     Conjunctiva/sclera:     Right eye: Right conjunctiva is injected. Exudate present.     Assessment/Plan: 1. Acute bacterial conjunctivitis of right eye (Primary) Use Gentamicin  eye drops, 1 drop to affected eye(s) every 4 hours while awake.  Use them until symptosm resolve.  Wash hands frequently, and avoid touching eyes.   - gentamicin  (GARAMYCIN ) 0.3 % ophthalmic solution; Place 1 drop into both eyes every 4 (four) hours.  Dispense: 5 mL; Refill: 0     General Counseling: William Gutierrez verbalizes understanding of the findings of todays visit and agrees with plan of treatment. I have discussed any further diagnostic evaluation that  may be needed or ordered today. We also reviewed his medications today. he has been encouraged to call the office with any questions or concerns that should arise related to todays visit.   No orders of the defined types were placed in this encounter.   Meds ordered this encounter  Medications   gentamicin  (GARAMYCIN ) 0.3 % ophthalmic solution    Sig: Place 1 drop into both eyes every 4 (four) hours.    Dispense:  5 mL    Refill:  0    Time spent:15 Minutes    Juliene DOROTHA Howells AGNP-C Nurse Practitioner
